# Patient Record
Sex: Male | Born: 1990 | Race: White | Hispanic: No | Marital: Married | Smoking: Current some day smoker
Health system: Southern US, Community
[De-identification: ages and names within clinical notes are randomized; demographics above are authoritative.]

## PROBLEM LIST (undated history)

## (undated) DIAGNOSIS — K219 Gastro-esophageal reflux disease without esophagitis: Secondary | ICD-10-CM

## (undated) DIAGNOSIS — I10 Essential (primary) hypertension: Secondary | ICD-10-CM

## (undated) DIAGNOSIS — I4510 Unspecified right bundle-branch block: Secondary | ICD-10-CM

## (undated) DIAGNOSIS — F410 Panic disorder [episodic paroxysmal anxiety] without agoraphobia: Secondary | ICD-10-CM

## (undated) HISTORY — DX: Gastro-esophageal reflux disease without esophagitis: K21.9

## (undated) HISTORY — DX: Unspecified right bundle-branch block: I45.10

## (undated) HISTORY — DX: Essential (primary) hypertension: I10

---

## 2012-12-18 ENCOUNTER — Emergency Department: Payer: Self-pay | Admitting: Internal Medicine

## 2012-12-18 LAB — COMPREHENSIVE METABOLIC PANEL
Bilirubin,Total: 0.5 mg/dL (ref 0.2–1.0)
Calcium, Total: 9.6 mg/dL (ref 8.5–10.1)
Chloride: 108 mmol/L — ABNORMAL HIGH (ref 98–107)
Creatinine: 1.01 mg/dL (ref 0.60–1.30)
Glucose: 109 mg/dL — ABNORMAL HIGH (ref 65–99)
Osmolality: 278 (ref 275–301)
Potassium: 3.6 mmol/L (ref 3.5–5.1)
SGOT(AST): 29 U/L (ref 15–37)
SGPT (ALT): 23 U/L (ref 12–78)
Total Protein: 8.2 g/dL (ref 6.4–8.2)

## 2012-12-18 LAB — URINALYSIS, COMPLETE
Hyaline Cast: 3
Nitrite: NEGATIVE
Protein: 100
RBC,UR: 1302 /HPF (ref 0–5)
Specific Gravity: 1.024 (ref 1.003–1.030)
Squamous Epithelial: <1

## 2012-12-18 LAB — CBC
HGB: 14.2 g/dL (ref 13.0–18.0)
MCV: 88 fL (ref 80–100)
Platelet: 237 10*3/uL (ref 150–440)
RBC: 4.69 10*6/uL (ref 4.40–5.90)
RDW: 13.2 % (ref 11.5–14.5)

## 2013-01-16 ENCOUNTER — Emergency Department: Payer: Self-pay | Admitting: Emergency Medicine

## 2013-01-16 LAB — BASIC METABOLIC PANEL
Anion Gap: 6 — ABNORMAL LOW (ref 7–16)
Calcium, Total: 9.3 mg/dL (ref 8.5–10.1)
Co2: 26 mmol/L (ref 21–32)
Creatinine: 1.1 mg/dL (ref 0.60–1.30)
EGFR (Non-African Amer.): >60
Potassium: 3.6 mmol/L (ref 3.5–5.1)
Sodium: 135 mmol/L — ABNORMAL LOW (ref 136–145)

## 2013-01-16 LAB — CBC
HCT: 40.6 % (ref 40.0–52.0)
HGB: 13.9 g/dL (ref 13.0–18.0)
MCHC: 34.2 g/dL (ref 32.0–36.0)
MCV: 87 fL (ref 80–100)
RBC: 4.67 10*6/uL (ref 4.40–5.90)
RDW: 12.9 % (ref 11.5–14.5)
WBC: 13.8 10*3/uL — ABNORMAL HIGH (ref 3.8–10.6)

## 2013-01-16 LAB — URINALYSIS, COMPLETE
Bacteria: NONE SEEN
Glucose,UR: NEGATIVE mg/dL (ref 0–75)
Leukocyte Esterase: NEGATIVE
Protein: 30
Specific Gravity: 1.021 (ref 1.003–1.030)
WBC UR: <1 /HPF (ref 0–5)

## 2013-01-28 ENCOUNTER — Ambulatory Visit: Payer: Self-pay | Admitting: Urology

## 2013-01-28 DIAGNOSIS — N201 Calculus of ureter: Secondary | ICD-10-CM | POA: Insufficient documentation

## 2013-01-28 DIAGNOSIS — N23 Unspecified renal colic: Secondary | ICD-10-CM

## 2013-01-28 HISTORY — DX: Calculus of ureter: N20.1

## 2013-01-28 HISTORY — DX: Unspecified renal colic: N23

## 2014-04-26 ENCOUNTER — Emergency Department: Payer: Self-pay | Admitting: Emergency Medicine

## 2014-05-11 ENCOUNTER — Encounter: Payer: Self-pay | Admitting: Cardiovascular Disease

## 2014-05-11 ENCOUNTER — Ambulatory Visit (INDEPENDENT_AMBULATORY_CARE_PROVIDER_SITE_OTHER): Payer: Self-pay | Admitting: Cardiovascular Disease

## 2014-05-11 VITALS — BP 120/90 | HR 75 | Ht 70.0 in | Wt 208.5 lb

## 2014-05-11 DIAGNOSIS — R0602 Shortness of breath: Secondary | ICD-10-CM | POA: Insufficient documentation

## 2014-05-11 DIAGNOSIS — Z87828 Personal history of other (healed) physical injury and trauma: Secondary | ICD-10-CM

## 2014-05-11 DIAGNOSIS — R079 Chest pain, unspecified: Secondary | ICD-10-CM | POA: Insufficient documentation

## 2014-05-11 NOTE — Patient Instructions (Addendum)
You are doing well. No medication changes were made.  Consider trying the naproxen 1 to 2 pills twice a day for chest pain  Please call us if you have new issues that need to be addressed before your next appt.   [facility] [address], [place] [phone]  [facility] [address], [place] [phone]  [facility] [address], [place] [phone]  [facility] [address] [place], [place] [phone]
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
## 2014-05-11 NOTE — Assessment & Plan Note (Signed)
Occasional shortness of breath when he wakes up at nighttime. Otherwise no significant symptoms in the day. Likely noncardiac in nature

## 2014-05-11 NOTE — Assessment & Plan Note (Signed)
Atypical chest pain. Symptoms reviewed with him in detail. He has no significant cardiac risk factors. Symptoms presenting at rest typically, infrequently. Recent workup in the emergency room was normal. No further workup at this time. Suggested he call the office if he starts to have symptoms more consistent with angina, with exertion. Suspect his symptoms are musculoskeletal in nature. Recommended he try NSAIDs, ice, light stretching

## 2016-03-22 ENCOUNTER — Emergency Department
Admission: EM | Admit: 2016-03-22 | Discharge: 2016-03-23 | Disposition: A | Discharge status: Home / Self Care | Payer: Managed Care, Other (non HMO) | Attending: Emergency Medicine | Admitting: Emergency Medicine

## 2016-03-22 ENCOUNTER — Emergency Department: Payer: Managed Care, Other (non HMO)

## 2016-03-22 ENCOUNTER — Encounter: Payer: Self-pay | Admitting: *Deleted

## 2016-03-22 DIAGNOSIS — N200 Calculus of kidney: Secondary | ICD-10-CM | POA: Diagnosis not present

## 2016-03-22 DIAGNOSIS — R55 Syncope and collapse: Secondary | ICD-10-CM | POA: Insufficient documentation

## 2016-03-22 DIAGNOSIS — R109 Unspecified abdominal pain: Secondary | ICD-10-CM | POA: Diagnosis present

## 2016-03-22 LAB — COMPREHENSIVE METABOLIC PANEL
ALT: 24 U/L (ref 17–63)
AST: 28 U/L (ref 15–41)
Albumin: 4.4 g/dL (ref 3.5–5.0)
Alkaline Phosphatase: 63 U/L (ref 38–126)
Anion gap: 7 (ref 5–15)
BUN: 12 mg/dL (ref 6–20)
CO2: 28 mmol/L (ref 22–32)
Calcium: 9.4 mg/dL (ref 8.9–10.3)
Chloride: 106 mmol/L (ref 101–111)
Creatinine, Ser: 0.87 mg/dL (ref 0.61–1.24)
GFR calc Af Amer: >60 mL/min (ref >60)
GFR calc non Af Amer: >60 mL/min (ref >60)
Glucose, Bld: 98 mg/dL (ref 65–99)
Potassium: 4.3 mmol/L (ref 3.5–5.1)
Sodium: 141 mmol/L (ref 135–145)
Total Bilirubin: 0.6 mg/dL (ref 0.3–1.2)
Total Protein: 7.8 g/dL (ref 6.5–8.1)

## 2016-03-22 LAB — URINALYSIS, COMPLETE (UACMP) WITH MICROSCOPIC
Bacteria, UA: NONE SEEN
Bilirubin Urine: NEGATIVE
Glucose, UA: NEGATIVE mg/dL
Hgb urine dipstick: NEGATIVE
Ketones, ur: NEGATIVE mg/dL
Leukocytes, UA: NEGATIVE
Nitrite: NEGATIVE
Protein, ur: NEGATIVE mg/dL
Specific Gravity, Urine: 1.021 (ref 1.005–1.030)
pH: 6 (ref 5.0–8.0)

## 2016-03-22 LAB — CBC
HEMATOCRIT: 41 % (ref 40.0–52.0)
Hemoglobin: 14.2 g/dL (ref 13.0–18.0)
MCH: 29.6 pg (ref 26.0–34.0)
MCHC: 34.6 g/dL (ref 32.0–36.0)
MCV: 85.4 fL (ref 80.0–100.0)
Platelets: 307 10*3/uL (ref 150–440)
RBC: 4.8 MIL/uL (ref 4.40–5.90)
RDW: 12.9 % (ref 11.5–14.5)
WBC: 9.4 10*3/uL (ref 3.8–10.6)

## 2016-03-22 MED ORDER — SODIUM CHLORIDE 0.9 % IV SOLN
Freq: Once | INTRAVENOUS | Status: AC
  Administered 2016-03-22: 22:00:00 via INTRAVENOUS

## 2016-03-22 NOTE — ED Notes (Signed)
Pt had blood drawn.  Soon afterwards, pt states I feel dizzy.  Pt sitting in chair.  Pt passed out and leaned backwards in chair striking his head on  the sink and pipes while still in the chair.  Pt awoke immediately, c-collar applied. Iv started stat.  Pt moved to subwait.  Pt alert and oriented.  Pt did not bite tongue.  No urinary incontience. .Marland Kitchen

## 2016-03-22 NOTE — ED Triage Notes (Signed)
Pt has right flank pain for 3 days.  No n/v/d.  Pt states hx of kidney stones.  Pt alert.

## 2016-03-23 ENCOUNTER — Emergency Department: Payer: Managed Care, Other (non HMO)

## 2016-03-23 MED ORDER — OXYCODONE-ACETAMINOPHEN 5-325 MG PO TABS: 1.0000 | ORAL_TABLET | ORAL | 0 refills | Status: DC | PRN

## 2016-03-23 MED ORDER — TAMSULOSIN HCL 0.4 MG PO CAPS: 0.4000 mg | ORAL_CAPSULE | Freq: Every day | ORAL | 0 refills | Status: DC
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
## 2016-10-08 ENCOUNTER — Encounter: Payer: Self-pay | Admitting: Emergency Medicine

## 2016-10-08 ENCOUNTER — Emergency Department: Payer: Managed Care, Other (non HMO)

## 2016-10-08 ENCOUNTER — Emergency Department
Admission: EM | Admit: 2016-10-08 | Discharge: 2016-10-08 | Disposition: A | Discharge status: Home / Self Care | Payer: Managed Care, Other (non HMO) | Attending: Emergency Medicine | Admitting: Emergency Medicine

## 2016-10-08 DIAGNOSIS — R0789 Other chest pain: Secondary | ICD-10-CM | POA: Insufficient documentation

## 2016-10-08 HISTORY — DX: Panic disorder (episodic paroxysmal anxiety): F41.0

## 2016-10-08 LAB — CBC
HEMATOCRIT: 40.1 % (ref 40.0–52.0)
HEMOGLOBIN: 13.8 g/dL (ref 13.0–18.0)
MCH: 29.3 pg (ref 26.0–34.0)
MCHC: 34.3 g/dL (ref 32.0–36.0)
MCV: 85.4 fL (ref 80.0–100.0)
Platelets: 258 10*3/uL (ref 150–440)
RBC: 4.7 MIL/uL (ref 4.40–5.90)
RDW: 13.3 % (ref 11.5–14.5)
WBC: 7.3 10*3/uL (ref 3.8–10.6)

## 2016-10-08 LAB — BASIC METABOLIC PANEL
ANION GAP: 7 (ref 5–15)
BUN: 15 mg/dL (ref 6–20)
CHLORIDE: 106 mmol/L (ref 101–111)
CO2: 25 mmol/L (ref 22–32)
Calcium: 9 mg/dL (ref 8.9–10.3)
Creatinine, Ser: 0.99 mg/dL (ref 0.61–1.24)
Glucose, Bld: 105 mg/dL — ABNORMAL HIGH (ref 65–99)
POTASSIUM: 3.5 mmol/L (ref 3.5–5.1)
SODIUM: 138 mmol/L (ref 135–145)

## 2016-10-08 LAB — TROPONIN I: Troponin I: <0.03 ng/mL (ref <0.03)

## 2016-10-08 NOTE — ED Triage Notes (Signed)
Patient with complaint of intermittent left side chest pain times one with come shortness of breath and nausea. Patient states that he has a history of a panic attack but this does not feel the same.

## 2016-10-08 NOTE — ED Notes (Signed)
Pt. Reports intermittent chest pain for the past week.  Pt. States similar symptoms 3 years ago when he had panic attack.

## 2016-10-08 NOTE — Discharge Instructions (Signed)
Please make an appointment to establish care with a primary care physician this week for recheck. Fortunately today your bloodwork EKG and chest x-ray were all reassuring and you did not have a heart attack. Return to the emergency department for any concerns.  It was a pleasure to take care of you today, and thank you for coming to our emergency department.  If you have any questions or concerns before leaving please ask the nurse to grab me and I'm more than happy to go through your aftercare instructions again.  If you were prescribed any opioid pain medication today such as Norco, Vicodin, Percocet, morphine, hydrocodone, or oxycodone please make sure you do not drive when you are taking this medication as it can alter your ability to drive safely.  If you have any concerns once you are home that you are not improving or are in fact getting worse before you can make it to your follow-up appointment, please do not hesitate to call [phone] and come back for further evaluation.  Merrily BrittleNeil Keirra Zeimet, MD  Results for orders placed or performed during the hospital encounter of 10/08/16  Basic metabolic panel  Result Value Ref Range   Sodium 138 135 - 145 mmol/L   Potassium 3.5 3.5 - 5.1 mmol/L   Chloride 106 101 - 111 mmol/L   CO2 25 22 - 32 mmol/L   Glucose, Bld 105 (H) 65 - 99 mg/dL   BUN 15 6 - 20 mg/dL   Creatinine, Ser 1.610.99 0.61 - 1.24 mg/dL   Calcium 9.0 8.9 - 09.610.3 mg/dL   GFR calc non Af Amer >60 >60 mL/min   GFR calc Af Amer >60 >60 mL/min   Anion gap 7 5 - 15  CBC  Result Value Ref Range   WBC 7.3 3.8 - 10.6 K/uL   RBC 4.70 4.40 - 5.90 MIL/uL   Hemoglobin 13.8 13.0 - 18.0 g/dL   HCT 04.540.1 40.940.0 - 81.152.0 %   MCV 85.4 80.0 - 100.0 fL   MCH 29.3 26.0 - 34.0 pg   MCHC 34.3 32.0 - 36.0 g/dL   RDW 91.413.3 78.211.5 - 95.614.5 %   Platelets 258 150 - 440 K/uL  Troponin I  Result Value Ref Range   Troponin I <0.03 <0.03 ng/mL   Dg Chest 2 View  Result Date: 10/08/2016 CLINICAL DATA:  Intermittent  left-sided chest pain with dyspnea and nausea. EXAM: CHEST  2 VIEW COMPARISON:  04/26/2014 FINDINGS: The lungs are clear. The pulmonary vasculature is normal. Heart size is normal. Hilar and mediastinal contours are unremarkable. There is no pleural effusion. IMPRESSION: No active cardiopulmonary disease. Electronically Signed   By: Ellery Plunkaniel R Mitchell M.D.   On: 10/08/2016 03:19
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
## 2016-10-08 NOTE — ED Notes (Signed)
Pt. Going home with friend 

## 2019-11-10 ENCOUNTER — Inpatient Hospital Stay
Admission: EM | Admit: 2019-11-10 | Discharge: 2019-11-12 | DRG: 177 | Disposition: A | Discharge status: Home / Self Care | Payer: BC Managed Care – PPO | Attending: Internal Medicine | Admitting: Internal Medicine

## 2019-11-10 ENCOUNTER — Emergency Department: Payer: BC Managed Care – PPO

## 2019-11-10 ENCOUNTER — Encounter: Payer: Self-pay | Admitting: Emergency Medicine

## 2019-11-10 ENCOUNTER — Other Ambulatory Visit: Payer: Self-pay

## 2019-11-10 DIAGNOSIS — I2721 Secondary pulmonary arterial hypertension: Secondary | ICD-10-CM | POA: Diagnosis present

## 2019-11-10 DIAGNOSIS — Z79899 Other long term (current) drug therapy: Secondary | ICD-10-CM | POA: Diagnosis not present

## 2019-11-10 DIAGNOSIS — I4510 Unspecified right bundle-branch block: Secondary | ICD-10-CM | POA: Diagnosis present

## 2019-11-10 DIAGNOSIS — U071 COVID-19: Secondary | ICD-10-CM | POA: Diagnosis present

## 2019-11-10 DIAGNOSIS — Z882 Allergy status to sulfonamides status: Secondary | ICD-10-CM | POA: Diagnosis not present

## 2019-11-10 DIAGNOSIS — F410 Panic disorder [episodic paroxysmal anxiety] without agoraphobia: Secondary | ICD-10-CM | POA: Diagnosis present

## 2019-11-10 DIAGNOSIS — J9601 Acute respiratory failure with hypoxia: Secondary | ICD-10-CM | POA: Diagnosis present

## 2019-11-10 DIAGNOSIS — R0902 Hypoxemia: Secondary | ICD-10-CM

## 2019-11-10 DIAGNOSIS — J9600 Acute respiratory failure, unspecified whether with hypoxia or hypercapnia: Secondary | ICD-10-CM | POA: Diagnosis not present

## 2019-11-10 DIAGNOSIS — I272 Pulmonary hypertension, unspecified: Secondary | ICD-10-CM | POA: Diagnosis not present

## 2019-11-10 DIAGNOSIS — Z87891 Personal history of nicotine dependence: Secondary | ICD-10-CM

## 2019-11-10 DIAGNOSIS — J1282 Pneumonia due to coronavirus disease 2019: Secondary | ICD-10-CM | POA: Diagnosis present

## 2019-11-10 DIAGNOSIS — Z8249 Family history of ischemic heart disease and other diseases of the circulatory system: Secondary | ICD-10-CM | POA: Diagnosis not present

## 2019-11-10 LAB — C-REACTIVE PROTEIN: CRP: 1.7 mg/dL — ABNORMAL HIGH (ref <1.0)

## 2019-11-10 LAB — PROCALCITONIN
Procalcitonin: <0.1 ng/mL
Procalcitonin: <0.1 ng/mL

## 2019-11-10 LAB — CBC WITH DIFFERENTIAL/PLATELET
Abs Immature Granulocytes: 0.02 10*3/uL (ref 0.00–0.07)
Basophils Absolute: 0 10*3/uL (ref 0.0–0.1)
Basophils Relative: 0 %
Eosinophils Absolute: 0 10*3/uL (ref 0.0–0.5)
Eosinophils Relative: 0 %
HCT: 42.9 % (ref 39.0–52.0)
Hemoglobin: 14.2 g/dL (ref 13.0–17.0)
Immature Granulocytes: 1 %
Lymphocytes Relative: 28 %
Lymphs Abs: 1.2 10*3/uL (ref 0.7–4.0)
MCH: 30.1 pg (ref 26.0–34.0)
MCHC: 33.1 g/dL (ref 30.0–36.0)
MCV: 90.9 fL (ref 80.0–100.0)
Monocytes Absolute: 0.5 10*3/uL (ref 0.1–1.0)
Monocytes Relative: 12 %
Neutro Abs: 2.5 10*3/uL (ref 1.7–7.7)
Neutrophils Relative %: 59 %
Platelets: 178 10*3/uL (ref 150–400)
RBC: 4.72 MIL/uL (ref 4.22–5.81)
RDW: 13.2 % (ref 11.5–15.5)
Smear Review: NORMAL
WBC: 4.2 10*3/uL (ref 4.0–10.5)
nRBC: 0 % (ref 0.0–0.2)

## 2019-11-10 LAB — FERRITIN: Ferritin: 607 ng/mL — ABNORMAL HIGH (ref 24–336)

## 2019-11-10 LAB — CBC
HCT: 44.8 % (ref 39.0–52.0)
Hemoglobin: 14.9 g/dL (ref 13.0–17.0)
MCH: 30 pg (ref 26.0–34.0)
MCHC: 33.3 g/dL (ref 30.0–36.0)
MCV: 90.1 fL (ref 80.0–100.0)
Platelets: 184 10*3/uL (ref 150–400)
RBC: 4.97 MIL/uL (ref 4.22–5.81)
RDW: 13.2 % (ref 11.5–15.5)
WBC: 5 10*3/uL (ref 4.0–10.5)
nRBC: 0 % (ref 0.0–0.2)

## 2019-11-10 LAB — BASIC METABOLIC PANEL
Anion gap: 10 (ref 5–15)
BUN: 14 mg/dL (ref 6–20)
CO2: 25 mmol/L (ref 22–32)
Calcium: 8.3 mg/dL — ABNORMAL LOW (ref 8.9–10.3)
Chloride: 100 mmol/L (ref 98–111)
Creatinine, Ser: 1.13 mg/dL (ref 0.61–1.24)
GFR calc Af Amer: >60 mL/min (ref >60)
GFR calc non Af Amer: >60 mL/min (ref >60)
Glucose, Bld: 118 mg/dL — ABNORMAL HIGH (ref 70–99)
Potassium: 3.7 mmol/L (ref 3.5–5.1)
Sodium: 135 mmol/L (ref 135–145)

## 2019-11-10 LAB — LACTATE DEHYDROGENASE: LDH: 209 U/L — ABNORMAL HIGH (ref 98–192)

## 2019-11-10 LAB — TROPONIN I (HIGH SENSITIVITY)
Troponin I (High Sensitivity): 4 ng/L (ref <18)
Troponin I (High Sensitivity): 8 ng/L (ref <18)
Troponin I (High Sensitivity): 4 ng/L (ref <18)

## 2019-11-10 LAB — FIBRIN DERIVATIVES D-DIMER (ARMC ONLY): Fibrin derivatives D-dimer (ARMC): 496.32 ng/mL (FEU) (ref 0.00–499.00)

## 2019-11-10 LAB — FIBRINOGEN: Fibrinogen: 631 mg/dL — ABNORMAL HIGH (ref 210–475)

## 2019-11-10 MED ORDER — FAMOTIDINE 20 MG PO TABS
20.0000 mg | ORAL_TABLET | Freq: Two times a day (BID) | ORAL | Status: DC
  Administered 2019-11-10 – 2019-11-12 (×4): 20 mg via ORAL
  Filled 2019-11-10 (×4): qty 1

## 2019-11-10 MED ORDER — HYDROCOD POLST-CPM POLST ER 10-8 MG/5ML PO SUER: 5.0000 mL | Freq: Two times a day (BID) | ORAL | Status: DC | PRN

## 2019-11-10 MED ORDER — SODIUM CHLORIDE 0.9 % IV SOLN
100.0000 mg | Freq: Every day | INTRAVENOUS | Status: DC
  Administered 2019-11-11 – 2019-11-12 (×2): 100 mg via INTRAVENOUS
  Filled 2019-11-10: qty 20
  Filled 2019-11-10: qty 100

## 2019-11-10 MED ORDER — ACETAMINOPHEN 325 MG PO TABS: 650.0000 mg | ORAL_TABLET | Freq: Four times a day (QID) | ORAL | Status: DC | PRN

## 2019-11-10 MED ORDER — ASPIRIN EC 81 MG PO TBEC
81.0000 mg | DELAYED_RELEASE_TABLET | Freq: Every day | ORAL | Status: DC
  Administered 2019-11-10 – 2019-11-12 (×3): 81 mg via ORAL
  Filled 2019-11-10 (×3): qty 1

## 2019-11-10 MED ORDER — ONDANSETRON HCL 4 MG PO TABS: 4.0000 mg | ORAL_TABLET | Freq: Four times a day (QID) | ORAL | Status: DC | PRN

## 2019-11-10 MED ORDER — ONDANSETRON HCL 4 MG/2ML IJ SOLN: 4.0000 mg | Freq: Four times a day (QID) | INTRAMUSCULAR | Status: DC | PRN

## 2019-11-10 MED ORDER — ZINC SULFATE 220 (50 ZN) MG PO CAPS
220.0000 mg | ORAL_CAPSULE | Freq: Every day | ORAL | Status: DC
  Administered 2019-11-10 – 2019-11-12 (×3): 220 mg via ORAL
  Filled 2019-11-10 (×3): qty 1

## 2019-11-10 MED ORDER — BARICITINIB 2 MG PO TABS
4.0000 mg | ORAL_TABLET | Freq: Every day | ORAL | Status: DC
  Administered 2019-11-10: 4 mg via ORAL
  Filled 2019-11-10: qty 2

## 2019-11-10 MED ORDER — TRAZODONE HCL 50 MG PO TABS: 25.0000 mg | ORAL_TABLET | Freq: Every evening | ORAL | Status: DC | PRN

## 2019-11-10 MED ORDER — ENOXAPARIN SODIUM 40 MG/0.4ML ~~LOC~~ SOLN
40.0000 mg | SUBCUTANEOUS | Status: DC
  Administered 2019-11-10 – 2019-11-11 (×2): 40 mg via SUBCUTANEOUS
  Filled 2019-11-10 (×2): qty 0.4

## 2019-11-10 MED ORDER — GUAIFENESIN-DM 100-10 MG/5ML PO SYRP
10.0000 mL | ORAL_SOLUTION | ORAL | Status: DC | PRN
  Filled 2019-11-10: qty 10

## 2019-11-10 MED ORDER — ASCORBIC ACID 500 MG PO TABS
500.0000 mg | ORAL_TABLET | Freq: Every day | ORAL | Status: DC
  Administered 2019-11-10 – 2019-11-12 (×3): 500 mg via ORAL
  Filled 2019-11-10 (×3): qty 1

## 2019-11-10 MED ORDER — IOHEXOL 350 MG/ML SOLN
75.0000 mL | Freq: Once | INTRAVENOUS | Status: AC | PRN
  Administered 2019-11-10: 75 mL via INTRAVENOUS
  Filled 2019-11-10: qty 75

## 2019-11-10 MED ORDER — SODIUM CHLORIDE 0.9 % IV SOLN
200.0000 mg | Freq: Once | INTRAVENOUS | Status: AC
  Administered 2019-11-10: 23:00:00 200 mg via INTRAVENOUS
  Filled 2019-11-10: qty 200

## 2019-11-10 MED ORDER — VITAMIN D 25 MCG (1000 UNIT) PO TABS
1000.0000 [IU] | ORAL_TABLET | Freq: Every day | ORAL | Status: DC
  Administered 2019-11-10 – 2019-11-12 (×3): 1000 [IU] via ORAL
  Filled 2019-11-10 (×3): qty 1

## 2019-11-10 MED ORDER — DEXAMETHASONE SODIUM PHOSPHATE 10 MG/ML IJ SOLN
6.0000 mg | INTRAMUSCULAR | Status: DC
  Administered 2019-11-10 – 2019-11-11 (×2): 6 mg via INTRAVENOUS
  Filled 2019-11-10 (×2): qty 1

## 2019-11-10 MED ORDER — SODIUM CHLORIDE 0.9 % IV SOLN: INTRAVENOUS | Status: DC

## 2019-11-10 MED ORDER — GUAIFENESIN ER 600 MG PO TB12
600.0000 mg | ORAL_TABLET | Freq: Two times a day (BID) | ORAL | Status: DC
  Administered 2019-11-10 – 2019-11-12 (×4): 600 mg via ORAL
  Filled 2019-11-10 (×4): qty 1

## 2019-11-10 MED ORDER — MAGNESIUM HYDROXIDE 400 MG/5ML PO SUSP
30.0000 mL | Freq: Every day | ORAL | Status: DC | PRN
  Filled 2019-11-10: qty 30

## 2019-11-10 NOTE — ED Triage Notes (Signed)
Pt in via EMS from home via EMS. Pt was confirmed COVID + 5 days ago. Pt with difficulty breathing today. Pt 89% on AR, pt placed on 4L sats increased. 122/66, HR 90

## 2019-11-10 NOTE — H&P (Signed)
[place]   PATIENT NAME: Philip Martinez    MR#:  [id number]  DATE OF BIRTH:  1990-10-23  DATE OF ADMISSION:  11/10/2019  PRIMARY CARE PHYSICIAN: Patient, No Pcp Per   REQUESTING/REFERRING PHYSICIAN: Dorothea Glassman, MD  CHIEF COMPLAINT:   Chief Complaint  Patient presents with  . Shortness of Breath    HISTORY OF PRESENT ILLNESS:  Philip Martinez  is a 29 y.o. Hispanic American male with no chronic medical problems, who presented to the emergency room with acute onset of dyspnea with diminished pulse oximetry to 90% when he was checking it on his apple watch.  He tested positive for COVID-19 on Tuesday and prior to that he started having generalized fatigue and body aches and headaches on Monday and get his results back on Wednesday.  His cough and wheezing started on Friday and and his cough has been mainly dry.  He admitted to loss of taste but maintains sense of smell.  He admitted to diarrhea without nausea or vomiting.  He has nasal congestion as well without sore throat or earache.  He had low-grade fever here without chills.  Upon presentation to the emergency room, temperature was 100.2 with pulse oximetry of 97 to 98% on 2 L of O2 by nasal cannula.  Labs revealed unremarkable BMP.  LDH was 209 and high-sensitivity troponin I was 4 and later 8 then 4.  Ferritin was 607 and procalcitonin less than 0.1.  CBC showed no abnormalities.  Fibrin derivatives D-dimer was 496.32 and fibrinogen 631.  Two-view chest x-ray showed normal focal pulmonary infiltrates or atelectasis within the posterior basal right lower lobe and chest CTA showed no evidence for PE but did show borderline enlarged pulmonary trunk that can be seen with pulmonary artery hypertension and multifocal patchy areas of mixed consolidative and groundglass opacity with more confluent opacity throughout the right lower lobe favoring multifocal pneumonia that is seen with COVID-19.  The patient will be admitted to a  medical monitored isolation bed for further evaluation and management. PAST MEDICAL HISTORY:   Past Medical History:  Diagnosis Date  . Incomplete RBBB   . Panic attack     PAST SURGICAL HISTORY:  History reviewed. No pertinent surgical history.  No previous surgeries.  SOCIAL HISTORY:   Social History   Tobacco Use  . Smoking status: Former Games developer  . Smokeless tobacco: Never Used  Substance Use Topics  . Alcohol use: Yes    Comment: occ    FAMILY HISTORY:   Family History  Problem Relation Age of Onset  . Hypertension Mother   . Heart failure Paternal Grandmother     DRUG ALLERGIES:   Allergies  Allergen Reactions  . Sulfur     Hives     REVIEW OF SYSTEMS:   ROS As per history of present illness. All pertinent systems were reviewed above. Constitutional, HEENT, cardiovascular, respiratory, GI, GU, musculoskeletal, neuro, psychiatric, endocrine, integumentary and hematologic systems were reviewed and are otherwise negative/unremarkable except for positive findings mentioned above in the HPI.   MEDICATIONS AT HOME:   Prior to Admission medications   Medication Sig Start Date End Date Taking? Authorizing Provider  oxyCODONE-acetaminophen (ROXICET) 5-325 MG tablet Take 1 tablet by mouth every 4 (four) hours as needed for severe pain. Patient not taking: Reported on 11/10/2019 03/23/16   Darci Current, MD  tamsulosin (FLOMAX) 0.4 MG CAPS capsule Take 1 capsule (0.4 mg total) by mouth daily after breakfast. Patient not taking:  Reported on 11/10/2019 03/23/16   Darci Current, MD      VITAL SIGNS:  Blood pressure 130/79, pulse 96, temperature 100.2 F (37.9 C), resp. rate 17, height 5\' 9"  (1.753 m), weight 90.7 kg, SpO2 95 %.  PHYSICAL EXAMINATION:  Physical Exam  GENERAL:  29 y.o.-year-old Caucasian male patient lying in the bed with mild conversational dyspnea. EYES: Pupils equal, round, reactive to light and accommodation. No scleral icterus.  Extraocular muscles intact.  HEENT: Head atraumatic, normocephalic. Oropharynx and nasopharynx clear.  NECK:  Supple, no jugular venous distention. No thyroid enlargement, no tenderness.  LUNGS: Diminished bibasal breath sounds with bibasal crackles. CARDIOVASCULAR: Regular rate and rhythm, S1, S2 normal. No murmurs, rubs, or gallops.  ABDOMEN: Soft, nondistended, nontender. Bowel sounds present. No organomegaly or mass.  EXTREMITIES: No pedal edema, cyanosis, or clubbing.  NEUROLOGIC: Cranial nerves II through XII are intact. Muscle strength 5/5 in all extremities. Sensation intact. Gait not checked.  PSYCHIATRIC: The patient is alert and oriented x 3.  Normal affect and good eye contact. SKIN: No obvious rash, lesion, or ulcer.   LABORATORY PANEL:   CBC Recent Labs  Lab 11/10/19 2145  WBC 4.2  HGB 14.2  HCT 42.9  PLT 178   ------------------------------------------------------------------------------------------------------------------  Chemistries  Recent Labs  Lab 11/10/19 1535  NA 135  K 3.7  CL 100  CO2 25  GLUCOSE 118*  BUN 14  CREATININE 1.13  CALCIUM 8.3*   ------------------------------------------------------------------------------------------------------------------  Cardiac Enzymes No results for input(s): TROPONINI in the last 168 hours. ------------------------------------------------------------------------------------------------------------------  RADIOLOGY:  DG Chest 2 View  Result Date: 11/10/2019 CLINICAL DATA:  Chest pain, dyspnea, COVID pneumonia EXAM: CHEST - 2 VIEW COMPARISON:  10/08/2016 FINDINGS: The lungs are well expanded and are symmetric. There has developed minimal focal pulmonary infiltrate or atelectasis within the a posterior basal right lower lobe. Lungs are otherwise clear. No pneumothorax or pleural effusion. Cardiac size within normal limits. No acute bone abnormality. IMPRESSION: Minimal focal pulmonary infiltrate or atelectasis  within the posterior basal right lower lobe. Electronically Signed   By: 10/10/2016 MD   On: 11/10/2019 16:27   CT Angio Chest PE W and/or Wo Contrast  Result Date: 11/10/2019 CLINICAL DATA:  COVID-19 positive, negative D-dimer, hypoxic EXAM: CT ANGIOGRAPHY CHEST WITH CONTRAST TECHNIQUE: Multidetector CT imaging of the chest was performed using the standard protocol during bolus administration of intravenous contrast. Multiplanar CT image reconstructions and MIPs were obtained to evaluate the vascular anatomy. CONTRAST:  70mL OMNIPAQUE IOHEXOL 350 MG/ML SOLN COMPARISON:  Radiograph 11/10/2019 FINDINGS: Cardiovascular: Satisfactory opacification the pulmonary arteries to the segmental level. No pulmonary artery filling defects are identified. Pulmonary trunk is borderline enlarged. Normal heart size. No pericardial effusion. The aorta is normal caliber. No acute luminal abnormality. No periaortic stranding or hemorrhage. Shared origin of brachiocephalic and left common carotid artery. Proximal great vessels otherwise unremarkable. Mediastinum/Nodes: Scattered subcentimeter low-attenuation mediastinal and hilar nodes favored to be reactive. No worrisome mediastinal, hilar or axillary adenopathy. No acute abnormality of the trachea or esophagus. Thyroid gland and thoracic inlet are unremarkable. Lungs/Pleura: Multifocal patchy areas of mixed consolidative and ground-glass opacity with more confluent opacity throughout the right lower lobe in a posterior, peripheral predominance. No pneumothorax. No effusion. Mild airways thickening. No convincing features of edema. Upper Abdomen: No acute abnormalities present in the visualized portions of the upper abdomen. Musculoskeletal: No acute osseous abnormality or suspicious osseous lesion. Multilevel degenerative changes are present in the imaged portions of the spine.  Mild degenerative changes in the shoulders as well. Review of the MIP images confirms the above  findings. IMPRESSION: 1. No evidence of pulmonary embolism. 2. Borderline enlarged pulmonary trunk, can be seen with pulmonary arterial hypertension. 3. Multifocal patchy areas of mixed consolidative and ground-glass opacity with more confluent opacity throughout the right lower lobe. Findings are favored to represent multifocal pneumonia in the setting of COVID-19. Electronically Signed   By: Kreg Shropshire M.D.   On: 11/10/2019 20:03      IMPRESSION AND PLAN:     1.  Multifocal pneumonia secondary to COVID-19 with subsequent hypoxemia. -The patient will be admitted to an isolation monitored bed with droplet and contact precautions. -The patient will be placed on scheduled Mucinex and as needed Tussionex. -We will avoid nebulization as much as we can, give bronchodilator MDI if needed, and with deterioration of oxygenation try to avoid BiPAP/CPAP if possible.    -Will obtain sputum Gram stain culture and sensitivity and follow blood cultures. -O2 protocol will be followed. -We will follow CRP, ferritin, LDH and D-dimer. -Will follow manual differential for ANC/ALC ratio as well as follow troponin I and daily CBC with manual differential and CMP. - Will place the patient on IV Remdesivir and IV steroid therapy with Decadron with elevated inflammatory markers. -The patient will be placed on vitamin D3, vitamin C, zinc sulfate, p.o. Pepcid and aspirin. -I discussed baricitinib with the patient and he agreed to proceed with it.  2.  Pulmonary artery hypertension. -This like secondary to his multifocal pneumonia. -Management as above.  3.  DVT prophylaxis. -Subcutaneous Lovenox.    All the records are reviewed and case discussed with ED provider. The plan of care was discussed in details with the patient (and family). I answered all questions. The patient agreed to proceed with the above mentioned plan. Further management will depend upon hospital course.   CODE STATUS: Full  code  Status is: Inpatient  Remains inpatient appropriate because:Ongoing diagnostic testing needed not appropriate for outpatient work up, Unsafe d/c plan, IV treatments appropriate due to intensity of illness or inability to take PO and Inpatient level of care appropriate due to severity of illness   Dispo: The patient is from: Home              Anticipated d/c is to: Home              Anticipated d/c date is: 2 days              Patient currently is not medically stable to d/c.   TOTAL TIME TAKING CARE OF THIS PATIENT: 55 minutes.    Hannah Beat M.D on 11/10/2019 at 10:45 PM  [facility]   From 7 PM-7 AM, contact night-coverage [email or web address]  CC: Primary care physician; Patient, No Pcp Per   Note: This dictation was prepared with Dragon dictation along with smaller phrase technology. Any transcriptional typo errors that result from this process are unintentional.

## 2019-11-10 NOTE — ED Notes (Signed)
Pt given meal tray and water to drink. Pt denies pain or needs at this time
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
## 2019-11-10 NOTE — Consult Note (Signed)
Remdesivir - Pharmacy Brief Note   O:  ALT: CMP ordered pending collection CXR: "Minimal focal pulmonary infiltrate or atelectasis within the posterior basal right lower lobe" SpO2: Hypoxic requiring supplemental O2   A/P:  Patient reported positive SARS-CoV-2 test 11/05/19. No testing available from this admission at this time to confirm.  Remdesivir 200 mg IVPB once followed by 100 mg IVPB daily x 4 days.   Tressie Ellis 11/10/2019 9:11 PM

## 2019-11-10 NOTE — ED Triage Notes (Signed)
Pt dx with covid last Wednesday.  Here for [facility] and slight chest pain that he thinks is from cough.  Unlabored at this time.  On 2 L [place] at this time.  NAD. Color WNL.  No increased WOB currently.

## 2019-11-11 LAB — COMPREHENSIVE METABOLIC PANEL
ALT: 36 U/L (ref 0–44)
AST: 30 U/L (ref 15–41)
Albumin: 3.8 g/dL (ref 3.5–5.0)
Alkaline Phosphatase: 34 U/L — ABNORMAL LOW (ref 38–126)
Anion gap: 10 (ref 5–15)
BUN: 12 mg/dL (ref 6–20)
CO2: 26 mmol/L (ref 22–32)
Calcium: 8.2 mg/dL — ABNORMAL LOW (ref 8.9–10.3)
Chloride: 100 mmol/L (ref 98–111)
Creatinine, Ser: 0.91 mg/dL (ref 0.61–1.24)
GFR calc Af Amer: >60 mL/min (ref >60)
GFR calc non Af Amer: >60 mL/min (ref >60)
Glucose, Bld: 140 mg/dL — ABNORMAL HIGH (ref 70–99)
Potassium: 4.3 mmol/L (ref 3.5–5.1)
Sodium: 136 mmol/L (ref 135–145)
Total Bilirubin: 0.7 mg/dL (ref 0.3–1.2)
Total Protein: 7.9 g/dL (ref 6.5–8.1)

## 2019-11-11 LAB — CBC WITH DIFFERENTIAL/PLATELET
Abs Immature Granulocytes: 0.03 10*3/uL (ref 0.00–0.07)
Basophils Absolute: 0 10*3/uL (ref 0.0–0.1)
Basophils Relative: 0 %
Eosinophils Absolute: 0 10*3/uL (ref 0.0–0.5)
Eosinophils Relative: 0 %
HCT: 42.1 % (ref 39.0–52.0)
Hemoglobin: 14.2 g/dL (ref 13.0–17.0)
Immature Granulocytes: 1 %
Lymphocytes Relative: 35 %
Lymphs Abs: 0.9 10*3/uL (ref 0.7–4.0)
MCH: 29.8 pg (ref 26.0–34.0)
MCHC: 33.7 g/dL (ref 30.0–36.0)
MCV: 88.3 fL (ref 80.0–100.0)
Monocytes Absolute: 0.3 10*3/uL (ref 0.1–1.0)
Monocytes Relative: 12 %
Neutro Abs: 1.4 10*3/uL — ABNORMAL LOW (ref 1.7–7.7)
Neutrophils Relative %: 52 %
Platelets: 208 10*3/uL (ref 150–400)
RBC: 4.77 MIL/uL (ref 4.22–5.81)
RDW: 13.1 % (ref 11.5–15.5)
Smear Review: NORMAL
WBC: 2.6 10*3/uL — ABNORMAL LOW (ref 4.0–10.5)
nRBC: 0 % (ref 0.0–0.2)

## 2019-11-11 LAB — MAGNESIUM: Magnesium: 2.3 mg/dL (ref 1.7–2.4)

## 2019-11-11 LAB — FIBRIN DERIVATIVES D-DIMER (ARMC ONLY): Fibrin derivatives D-dimer (ARMC): 460.58 ng/mL (FEU) (ref 0.00–499.00)

## 2019-11-11 LAB — C-REACTIVE PROTEIN: CRP: 1.9 mg/dL — ABNORMAL HIGH (ref <1.0)

## 2019-11-11 LAB — HIV ANTIBODY (ROUTINE TESTING W REFLEX): HIV Screen 4th Generation wRfx: NONREACTIVE

## 2019-11-11 LAB — FERRITIN: Ferritin: 665 ng/mL — ABNORMAL HIGH (ref 24–336)

## 2019-11-11 NOTE — Progress Notes (Signed)
Confirmed with [facility] department, patient tested positive on 11/04/19 for Covid-19.

## 2019-11-11 NOTE — Progress Notes (Signed)
PROGRESS NOTE    Philip Martinez  MOQ:[id number] DOB: Sep 18, 1990 DOA: 11/10/2019 PCP: Patient, No Pcp Per    Brief Narrative:   29 y.o. Hispanic American male with no chronic medical problems, who presented to the emergency room with acute onset of dyspnea with diminished pulse oximetry to 90% when he was checking it on his apple watch.  He tested positive for COVID-19 on Tuesday and prior to that he started having generalized fatigue and body aches and headaches on Monday and get his results back on Wednesday.  His cough and wheezing started on Friday and and his cough has been mainly dry.  He admitted to loss of taste but maintains sense of smell.  He admitted to diarrhea without nausea or vomiting.  He has nasal congestion as well without sore throat or earache.  He had low-grade fever here without chills.  8/31: Patient seen and examined.  Continues to have some respiratory distress but overall clinically improving.  Currently on room air.   Assessment & Plan:   Active Problems:   Pneumonia due to 2019 novel coronavirus  Multifocal pneumonia secondary to COVID-19 Mild hypoxia secondary to above Oxygen requirement has been weaned off at this time No fevers over interval Plan: Continue remdesivir, 11/10/2019- Continue Decadron, 11/10/2019- No indication for baricitinib No indication for systemic antibiotics Continue to prone as tolerated Stress incentive spirometry use Continue supplementary vitamins Oxygen as needed Patient remains fever free with no oxygen requirement can likely discharge home tomorrow 11/12/2019  Possible pulmonary arterial hypertension Noted on CT Unclear etiology, likely secondary to acute issues Continue to monitor Consider outpatient follow-up for repeat study after resolution of acute illness   DVT prophylaxis: Lovenox Code Status: Full Family Communication: None today.  Plan of care discussed with patient Disposition Plan: Status is:  Inpatient  Remains inpatient appropriate because:Inpatient level of care appropriate due to severity of illness   Dispo: The patient is from: Home              Anticipated d/c is to: Home              Anticipated d/c date is: 1 day              Patient currently is not medically stable to d/c.  While the patient has been weaned off oxygen his shortness of breath still persistent.  We will plan on 1 additional day of inpatient monitoring for remdesivir, steroids.  Patient remains fever free with no oxygen requirement anticipate discharge home on 11/12/2019       Consultants:   None  Procedures:   None  Antimicrobials:   Remdesivir   Subjective: Seen and examined.  Reports improvement in shortness of breath and cough.  Objective: Vitals:   11/10/19 2145 11/10/19 2232 11/11/19 0441 11/11/19 0723  BP: (!) 142/86 130/79 116/83 121/77  Pulse: 88 96 70 68  Resp: 12 17 17 18   Temp:  100.2 F (37.9 C) 97.8 F (36.6 C) 97.8 F (36.6 C)  TempSrc:   Oral   SpO2: 97% 95% 95% 96%  Weight:      Height:        Intake/Output Summary (Last 24 hours) at 11/11/2019 1024 Last data filed at 11/11/2019 0400 Gross per 24 hour  Intake 471.67 ml  Output --  Net 471.67 ml   Filed Weights   11/10/19 1532  Weight: 90.7 kg    Examination:  General exam: Appears calm and comfortable  Respiratory system: Scattered crackles  bilaterally.  Normal work of breathing.  Room air Cardiovascular system: S1 & S2 heard, RRR. No JVD, murmurs, rubs, gallops or clicks. No pedal edema. Gastrointestinal system: Abdomen is nondistended, soft and nontender. No organomegaly or masses felt. Normal bowel sounds heard. Central nervous system: Alert and oriented. No focal neurological deficits. Extremities: Symmetric 5 x 5 power. Skin: No rashes, lesions or ulcers Psychiatry: Judgement and insight appear normal. Mood & affect appropriate.     Data Reviewed: I have personally reviewed following labs  and imaging studies  CBC: Recent Labs  Lab 11/10/19 1535 11/10/19 2145 11/11/19 0510  WBC 5.0 4.2 2.6*  NEUTROABS  --  2.5 1.4*  HGB 14.9 14.2 14.2  HCT 44.8 42.9 42.1  MCV 90.1 90.9 88.3  PLT 184 178 208   Basic Metabolic Panel: Recent Labs  Lab 11/10/19 1535 11/11/19 0510  NA 135 136  K 3.7 4.3  CL 100 100  CO2 25 26  GLUCOSE 118* 140*  BUN 14 12  CREATININE 1.13 0.91  CALCIUM 8.3* 8.2*  MG  --  2.3   GFR: Estimated Creatinine Clearance: 133.3 mL/min (by C-G formula based on SCr of 0.91 mg/dL). Liver Function Tests: Recent Labs  Lab 11/11/19 0510  AST 30  ALT 36  ALKPHOS 34*  BILITOT 0.7  PROT 7.9  ALBUMIN 3.8   No results for input(s): LIPASE, AMYLASE in the last 168 hours. No results for input(s): AMMONIA in the last 168 hours. Coagulation Profile: No results for input(s): INR, PROTIME in the last 168 hours. Cardiac Enzymes: No results for input(s): CKTOTAL, CKMB, CKMBINDEX, TROPONINI in the last 168 hours. BNP (last 3 results) No results for input(s): PROBNP in the last 8760 hours. HbA1C: No results for input(s): HGBA1C in the last 72 hours. CBG: No results for input(s): GLUCAP in the last 168 hours. Lipid Profile: No results for input(s): CHOL, HDL, LDLCALC, TRIG, CHOLHDL, LDLDIRECT in the last 72 hours. Thyroid Function Tests: No results for input(s): TSH, T4TOTAL, FREET4, T3FREE, THYROIDAB in the last 72 hours. Anemia Panel: Recent Labs    11/10/19 2145 11/11/19 0510  FERRITIN 607* 665*   Sepsis Labs: Recent Labs  Lab 11/10/19 1904 11/10/19 2145  PROCALCITON <0.10 <0.10    No results found for this or any previous visit (from the past 240 hour(s)).       Radiology Studies: DG Chest 2 View  Result Date: 11/10/2019 CLINICAL DATA:  Chest pain, dyspnea, COVID pneumonia EXAM: CHEST - 2 VIEW COMPARISON:  10/08/2016 FINDINGS: The lungs are well expanded and are symmetric. There has developed minimal focal pulmonary infiltrate or  atelectasis within the a posterior basal right lower lobe. Lungs are otherwise clear. No pneumothorax or pleural effusion. Cardiac size within normal limits. No acute bone abnormality. IMPRESSION: Minimal focal pulmonary infiltrate or atelectasis within the posterior basal right lower lobe. Electronically Signed   By: Helyn Numbers MD   On: 11/10/2019 16:27   CT Angio Chest PE W and/or Wo Contrast  Result Date: 11/10/2019 CLINICAL DATA:  COVID-19 positive, negative D-dimer, hypoxic EXAM: CT ANGIOGRAPHY CHEST WITH CONTRAST TECHNIQUE: Multidetector CT imaging of the chest was performed using the standard protocol during bolus administration of intravenous contrast. Multiplanar CT image reconstructions and MIPs were obtained to evaluate the vascular anatomy. CONTRAST:  83mL OMNIPAQUE IOHEXOL 350 MG/ML SOLN COMPARISON:  Radiograph 11/10/2019 FINDINGS: Cardiovascular: Satisfactory opacification the pulmonary arteries to the segmental level. No pulmonary artery filling defects are identified. Pulmonary trunk is borderline enlarged. Normal  heart size. No pericardial effusion. The aorta is normal caliber. No acute luminal abnormality. No periaortic stranding or hemorrhage. Shared origin of brachiocephalic and left common carotid artery. Proximal great vessels otherwise unremarkable. Mediastinum/Nodes: Scattered subcentimeter low-attenuation mediastinal and hilar nodes favored to be reactive. No worrisome mediastinal, hilar or axillary adenopathy. No acute abnormality of the trachea or esophagus. Thyroid gland and thoracic inlet are unremarkable. Lungs/Pleura: Multifocal patchy areas of mixed consolidative and ground-glass opacity with more confluent opacity throughout the right lower lobe in a posterior, peripheral predominance. No pneumothorax. No effusion. Mild airways thickening. No convincing features of edema. Upper Abdomen: No acute abnormalities present in the visualized portions of the upper abdomen.  Musculoskeletal: No acute osseous abnormality or suspicious osseous lesion. Multilevel degenerative changes are present in the imaged portions of the spine. Mild degenerative changes in the shoulders as well. Review of the MIP images confirms the above findings. IMPRESSION: 1. No evidence of pulmonary embolism. 2. Borderline enlarged pulmonary trunk, can be seen with pulmonary arterial hypertension. 3. Multifocal patchy areas of mixed consolidative and ground-glass opacity with more confluent opacity throughout the right lower lobe. Findings are favored to represent multifocal pneumonia in the setting of COVID-19. Electronically Signed   By: Kreg Shropshire M.D.   On: 11/10/2019 20:03        Scheduled Meds:  vitamin C  500 mg Oral Daily   aspirin EC  81 mg Oral Daily   cholecalciferol  1,000 Units Oral Daily   dexamethasone (DECADRON) injection  6 mg Intravenous Q24H   enoxaparin (LOVENOX) injection  40 mg Subcutaneous Q24H   famotidine  20 mg Oral BID   guaiFENesin  600 mg Oral BID   zinc sulfate  220 mg Oral Daily   Continuous Infusions:  remdesivir 100 mg in NS 100 mL 100 mg (11/11/19 0847)     LOS: 1 day    Time spent: 25 minutes    Tresa Moore, MD [facility] Pager [phone]  If 7PM-7AM, please contact night-coverage 11/11/2019, 10:24 AM

## 2019-11-12 DIAGNOSIS — J9600 Acute respiratory failure, unspecified whether with hypoxia or hypercapnia: Secondary | ICD-10-CM

## 2019-11-12 LAB — FERRITIN: Ferritin: 628 ng/mL — ABNORMAL HIGH (ref 24–336)

## 2019-11-12 LAB — CBC WITH DIFFERENTIAL/PLATELET
Abs Immature Granulocytes: 0.02 10*3/uL (ref 0.00–0.07)
Basophils Absolute: 0 10*3/uL (ref 0.0–0.1)
Basophils Relative: 0 %
Eosinophils Absolute: 0 10*3/uL (ref 0.0–0.5)
Eosinophils Relative: 0 %
HCT: 41.1 % (ref 39.0–52.0)
Hemoglobin: 13.5 g/dL (ref 13.0–17.0)
Immature Granulocytes: 1 %
Lymphocytes Relative: 26 %
Lymphs Abs: 0.9 10*3/uL (ref 0.7–4.0)
MCH: 29.7 pg (ref 26.0–34.0)
MCHC: 32.8 g/dL (ref 30.0–36.0)
MCV: 90.5 fL (ref 80.0–100.0)
Monocytes Absolute: 0.4 10*3/uL (ref 0.1–1.0)
Monocytes Relative: 11 %
Neutro Abs: 2 10*3/uL (ref 1.7–7.7)
Neutrophils Relative %: 62 %
Platelets: 259 10*3/uL (ref 150–400)
RBC: 4.54 MIL/uL (ref 4.22–5.81)
RDW: 13.1 % (ref 11.5–15.5)
Smear Review: NORMAL
WBC: 3.2 10*3/uL — ABNORMAL LOW (ref 4.0–10.5)
nRBC: 0 % (ref 0.0–0.2)

## 2019-11-12 LAB — COMPREHENSIVE METABOLIC PANEL
ALT: 34 U/L (ref 0–44)
AST: 26 U/L (ref 15–41)
Albumin: 3.7 g/dL (ref 3.5–5.0)
Alkaline Phosphatase: 31 U/L — ABNORMAL LOW (ref 38–126)
Anion gap: 9 (ref 5–15)
BUN: 15 mg/dL (ref 6–20)
CO2: 25 mmol/L (ref 22–32)
Calcium: 8.9 mg/dL (ref 8.9–10.3)
Chloride: 103 mmol/L (ref 98–111)
Creatinine, Ser: 0.92 mg/dL (ref 0.61–1.24)
GFR calc Af Amer: >60 mL/min (ref >60)
GFR calc non Af Amer: >60 mL/min (ref >60)
Glucose, Bld: 134 mg/dL — ABNORMAL HIGH (ref 70–99)
Potassium: 4.4 mmol/L (ref 3.5–5.1)
Sodium: 137 mmol/L (ref 135–145)
Total Bilirubin: 0.7 mg/dL (ref 0.3–1.2)
Total Protein: 7.5 g/dL (ref 6.5–8.1)

## 2019-11-12 LAB — MAGNESIUM: Magnesium: 2.3 mg/dL (ref 1.7–2.4)

## 2019-11-12 LAB — FIBRIN DERIVATIVES D-DIMER (ARMC ONLY): Fibrin derivatives D-dimer (ARMC): 417.72 ng/mL (FEU) (ref 0.00–499.00)

## 2019-11-12 LAB — C-REACTIVE PROTEIN: CRP: 1 mg/dL — ABNORMAL HIGH (ref <1.0)

## 2019-11-12 MED ORDER — ASCORBIC ACID 500 MG PO TABS
500.0000 mg | ORAL_TABLET | Freq: Every day | ORAL | 0 refills | Status: DC
Start: 2019-11-12 — End: 2023-08-14

## 2019-11-12 MED ORDER — HYDROCOD POLST-CPM POLST ER 10-8 MG/5ML PO SUER
5.0000 mL | Freq: Two times a day (BID) | ORAL | 0 refills | Status: DC | PRN
Start: 2019-11-12 — End: 2023-08-14

## 2019-11-12 MED ORDER — ALBUTEROL SULFATE HFA 108 (90 BASE) MCG/ACT IN AERS: 2.0000 | INHALATION_SPRAY | Freq: Four times a day (QID) | RESPIRATORY_TRACT | 0 refills | Status: DC | PRN

## 2019-11-12 MED ORDER — VITAMIN D3 25 MCG PO TABS: 1000.0000 [IU] | ORAL_TABLET | Freq: Every day | ORAL | 0 refills | Status: DC

## 2019-11-12 MED ORDER — DEXAMETHASONE 6 MG PO TABS: 6.0000 mg | ORAL_TABLET | Freq: Every day | ORAL | 0 refills | Status: DC

## 2019-11-12 MED ORDER — ZINC SULFATE 220 (50 ZN) MG PO CAPS: 220.0000 mg | ORAL_CAPSULE | Freq: Every day | ORAL | 0 refills | Status: DC

## 2019-11-12 NOTE — Discharge Instructions (Signed)
Patient scheduled for outpatient Remdesivir infusions at 9am on Thursday 9/2 and Friday 9/3 at [facility]. Please inform the patient to park at [address] [place], [place], as staff will be escorting the patient through the east entrance of the hospital. The appointments will take approximately 45 minutes.    There is a wave flag banner located near the entrance on [address]. Turn into this entrance and immediately turn left and park in 1 of the 5 designated Covid Infusion Parking spots. There is a phone number on the sign, please call and let the staff know what spot you are in and we will come out and get you. For questions call [phone].  Thanks.    10 Things You Can Do to Manage Your COVID-19 Symptoms at Home If you have possible or confirmed COVID-19: 1. Stay home from work and school. And stay away from other public places. If you must go out, avoid using any kind of public transportation, ridesharing, or taxis. 2. Monitor your symptoms carefully. If your symptoms get worse, call your healthcare provider immediately. 3. Get rest and stay hydrated. 4. If you have a medical appointment, call the healthcare provider ahead of time and tell them that you have or may have COVID-19. 5. For medical emergencies, call 911 and notify the dispatch personnel that you have or may have COVID-19. 6. Cover your cough and sneezes with a tissue or use the inside of your elbow. 7. Wash your hands often with soap and water for at least 20 seconds or clean your hands with an alcohol-based hand sanitizer that contains at least 60% alcohol. 8. As much as possible, stay in a specific room and away from other people in your home. Also, you should use a separate bathroom, if available. If you need to be around other people in or outside of the home, wear a mask. 9. Avoid sharing personal items with other people in your household, like dishes, towels, and bedding. 10. Clean all surfaces that are touched  often, like counters, tabletops, and doorknobs. Use household cleaning sprays or wipes according to the label instructions. [employer] 09/11/2018 This information is not intended to replace advice given to you by your health care provider. Make sure you discuss any questions you have with your health care provider. Document Revised: 02/13/2019 Document Reviewed: 02/13/2019 Elsevier Patient Education  2020 Elsevier Inc.  COVID-19: How to Protect Yourself and Others Know how it spreads  There is currently no vaccine to prevent coronavirus disease 2019 (COVID-19).  The best way to prevent illness is to avoid being exposed to this virus.  The virus is thought to spread mainly from person-to-person. ? Between people who are in close contact with one another (within about 6 feet). ? Through respiratory droplets produced when an infected person coughs, sneezes or talks. ? These droplets can land in the mouths or noses of people who are nearby or possibly be inhaled into the lungs. ? COVID-19 may be spread by people who are not showing symptoms. Everyone should Clean your hands often  Wash your hands often with soap and water for at least 20 seconds especially after you have been in a public place, or after blowing your nose, coughing, or sneezing.  If soap and water are not readily available, use a hand sanitizer that contains at least 60% alcohol. Cover all surfaces of your hands and rub them together until they feel dry.  Avoid touching your eyes, nose, and mouth with  unwashed hands. Avoid close contact  Limit contact with others as much as possible.  Avoid close contact with people who are sick.  Put distance between yourself and other people. ? Remember that some people without symptoms may be able to spread virus. ? This is especially important for people who are at higher risk of getting very  [employer] Cover your mouth and nose with a mask when around others  You could spread COVID-19 to others even if you do not feel sick.  Everyone should wear a mask in public settings and when around people not living in their household, especially when social distancing is difficult to maintain. ? Masks should not be placed on young children under age 52, anyone who has trouble breathing, or is unconscious, incapacitated or otherwise unable to remove the mask without assistance.  The mask is meant to protect other people in case you are infected.  Do NOT use a facemask meant for a Research scientist (physical sciences).  Continue to keep about 6 feet between yourself and others. The mask is not a substitute for social distancing. Cover coughs and sneezes  Always cover your mouth and nose with a tissue when you cough or sneeze or use the inside of your elbow.  Throw used tissues in the trash.  Immediately wash your hands with soap and water for at least 20 seconds. If soap and water are not readily available, clean your hands with a hand sanitizer that contains at least 60% alcohol. Clean and disinfect  Clean AND disinfect frequently touched surfaces daily. This includes tables, doorknobs, light switches, countertops, handles, desks, phones, keyboards, toilets, faucets, and sinks. [employer]  If surfaces are dirty, clean them: Use detergent or soap and water prior to disinfection.  Then, use a household disinfectant. You can see a list of EPA-registered household disinfectants here. [employer] 11/13/2018 This information is not intended to replace advice given to you by your health care provider. Make sure you discuss any questions you have with your health care provider. Document Revised: 11/21/2018 Document Reviewed: 09/19/2018 Elsevier Patient  Education  2020 [employer].

## 2019-11-12 NOTE — Discharge Summary (Signed)
[facility] [place] at [facility]   PATIENT NAME: Philip Martinez    MR#:  [id number]  DATE OF BIRTH:  10/18/1990  DATE OF ADMISSION:  11/10/2019 ADMITTING PHYSICIAN: Hannah Beat, MD  DATE OF DISCHARGE: 11/12/2019  2:12 PM  PRIMARY CARE PHYSICIAN: Patient, No Pcp Per    ADMISSION DIAGNOSIS:  Hypoxia [R09.02] Pneumonia due to 2019 novel coronavirus [U07.1, J12.82] COVID-19 [U07.1]  DISCHARGE DIAGNOSIS:  Active Problems:   Pneumonia due to 2019 novel coronavirus   SECONDARY DIAGNOSIS:   Past Medical History:  Diagnosis Date  . Incomplete RBBB   . Panic attack     HOSPITAL COURSE:   1.  Acute hypoxic respiratory failure secondary to COVID-19 pneumonia.  The patient is off oxygen and saturating well.  He felt well enough to go home.  He received 3 days of remdesivir here in the hospital.  Was also on IV Solu-Medrol.  I messaged the remdesivir infusion personnel to set up outpatient remdesivir infusion for 11/13/2019 and 11/14/2019.  Will finish up 7 more days of steroid.  Continue vitamin C and zinc.  Inhaler prescribed.  CT scan negative for pulmonary embolism.  Patient felt a lot better than when he came in and was stable for discharge home.  DISCHARGE CONDITIONS:   Satisfactory  CONSULTS OBTAINED:  None  DRUG ALLERGIES:   Allergies  Allergen Reactions  . Sulfur     Hives     DISCHARGE MEDICATIONS:   Allergies as of 11/12/2019      Reactions   Sulfur    Hives      Medication List    STOP taking these medications   oxyCODONE-acetaminophen 5-325 MG tablet Commonly known as: Roxicet   tamsulosin 0.4 MG Caps capsule Commonly known as: Flomax     TAKE these medications   albuterol 108 (90 Base) MCG/ACT inhaler Commonly known as: VENTOLIN HFA Inhale 2 puffs into the lungs every 6 (six) hours as needed for wheezing or shortness of breath.   ascorbic acid 500 MG tablet Commonly known as: VITAMIN C Take 1 tablet (500 mg total) by mouth  daily.   chlorpheniramine-HYDROcodone 10-8 MG/5ML Suer Commonly known as: TUSSIONEX Take 5 mLs by mouth every 12 (twelve) hours as needed for cough.   dexamethasone 6 MG tablet Commonly known as: DECADRON Take 1 tablet (6 mg total) by mouth daily. Start taking on: November 13, 2019   Vitamin D3 25 MCG tablet Commonly known as: Vitamin D Take 1 tablet (1,000 Units total) by mouth daily.   zinc sulfate 220 (50 Zn) MG capsule Take 1 capsule (220 mg total) by mouth daily.        DISCHARGE INSTRUCTIONS:   Follow-up your medical doctor 2 weeks [facility]-up [facility] on 9/2 and 9/3 at 9 AM for remdesivir infusion.  If you experience worsening of your admission symptoms, develop shortness of breath, life threatening emergency, suicidal or homicidal thoughts you must seek medical attention immediately by calling 911 or calling your MD immediately  if symptoms less severe.  You Must read complete instructions/literature along with all the possible adverse reactions/side effects for all the Medicines you take and that have been prescribed to you. Take any new Medicines after you have completely understood and accept all the possible adverse reactions/side effects.   Please note  You were cared for by a hospitalist during your hospital stay. If you have any questions about your discharge medications or the care you received while you were  in the hospital after you are discharged, you can call the unit and asked to speak with the hospitalist on call if the hospitalist that took care of you is not available. Once you are discharged, your primary care physician will handle any further medical issues. Please note that NO REFILLS for any discharge medications will be authorized once you are discharged, as it is imperative that you return to your primary care physician (or establish a relationship with a primary care physician if you do not have one) for your aftercare needs so that they can  reassess your need for medications and monitor your lab values.    Today   CHIEF COMPLAINT:   Chief Complaint  Patient presents with  . Shortness of Breath    HISTORY OF PRESENT ILLNESS:  Philip Martinez  is a 29 y.o. male admitted with shortness of breath and found to have COVID-19 pneumonia   VITAL SIGNS:  Blood pressure 105/66, pulse 64, temperature 98.1 F (36.7 C), temperature source Oral, resp. rate 18, height 5\' 9"  (1.753 m), weight 90.7 kg, SpO2 92 %.  PHYSICAL EXAMINATION:  GENERAL:  29 y.o.-year-old patient lying in the bed with no acute distress.  EYES: Pupils equal, round, reactive to light and accommodation. No scleral icterus. Extraocular muscles intact.  HEENT: Head atraumatic, normocephalic. Oropharynx and nasopharynx clear.  LUNGS: Normal breath sounds bilaterally, no wheezing, rales,rhonchi or crepitation. No use of accessory muscles of respiration.  CARDIOVASCULAR: S1, S2 normal. No murmurs, rubs, or gallops.  ABDOMEN: Soft, non-tender, non-distended. Bowel sounds present. No organomegaly or mass.  EXTREMITIES: No pedal edema, cyanosis, or clubbing.  NEUROLOGIC: Cranial nerves II through XII are intact. Muscle strength 5/5 in all extremities. Sensation intact. Gait not checked.  PSYCHIATRIC: The patient is alert and oriented x 3.  SKIN: No obvious rash, lesion, or ulcer.   DATA REVIEW:   CBC Recent Labs  Lab 11/12/19 0625  WBC 3.2*  HGB 13.5  HCT 41.1  PLT 259    Chemistries  Recent Labs  Lab 11/12/19 0625  NA 137  K 4.4  CL 103  CO2 25  GLUCOSE 134*  BUN 15  CREATININE 0.92  CALCIUM 8.9  MG 2.3  AST 26  ALT 34  ALKPHOS 31*  BILITOT 0.7     RADIOLOGY:  CT Angio Chest PE W and/or Wo Contrast  Result Date: 11/10/2019 CLINICAL DATA:  COVID-19 positive, negative D-dimer, hypoxic EXAM: CT ANGIOGRAPHY CHEST WITH CONTRAST TECHNIQUE: Multidetector CT imaging of the chest was performed using the standard protocol during bolus  administration of intravenous contrast. Multiplanar CT image reconstructions and MIPs were obtained to evaluate the vascular anatomy. CONTRAST:  91mL OMNIPAQUE IOHEXOL 350 MG/ML SOLN COMPARISON:  Radiograph 11/10/2019 FINDINGS: Cardiovascular: Satisfactory opacification the pulmonary arteries to the segmental level. No pulmonary artery filling defects are identified. Pulmonary trunk is borderline enlarged. Normal heart size. No pericardial effusion. The aorta is normal caliber. No acute luminal abnormality. No periaortic stranding or hemorrhage. Shared origin of brachiocephalic and left common carotid artery. Proximal great vessels otherwise unremarkable. Mediastinum/Nodes: Scattered subcentimeter low-attenuation mediastinal and hilar nodes favored to be reactive. No worrisome mediastinal, hilar or axillary adenopathy. No acute abnormality of the trachea or esophagus. Thyroid gland and thoracic inlet are unremarkable. Lungs/Pleura: Multifocal patchy areas of mixed consolidative and ground-glass opacity with more confluent opacity throughout the right lower lobe in a posterior, peripheral predominance. No pneumothorax. No effusion. Mild airways thickening. No convincing features of edema. Upper Abdomen: No acute abnormalities  present in the visualized portions of the upper abdomen. Musculoskeletal: No acute osseous abnormality or suspicious osseous lesion. Multilevel degenerative changes are present in the imaged portions of the spine. Mild degenerative changes in the shoulders as well. Review of the MIP images confirms the above findings. IMPRESSION: 1. No evidence of pulmonary embolism. 2. Borderline enlarged pulmonary trunk, can be seen with pulmonary arterial hypertension. 3. Multifocal patchy areas of mixed consolidative and ground-glass opacity with more confluent opacity throughout the right lower lobe. Findings are favored to represent multifocal pneumonia in the setting of COVID-19. Electronically Signed    By: Kreg Shropshire M.D.   On: 11/10/2019 20:03      Management plans discussed with the patient, family and they are in agreement.  CODE STATUS:     Code Status Orders  (From admission, onward)         Start     Ordered   11/10/19 2058  Full code  Continuous        11/10/19 2059        Code Status History    This patient has a current code status but no historical code status.   Advance Care Planning Activity      TOTAL TIME TAKING CARE OF THIS PATIENT: 40 minutes, in coordination of care and setting up outpatient remdesivir infusions.Alford Highland M.D on 11/12/2019 at 4:23 PM  Between 7am to 6pm - Pager - [phone]  After 6pm go to [email or web address] - password [email or web address]  [facility]  CC: Primary care physician; Patient, No Pcp Per

## 2019-11-12 NOTE — Progress Notes (Signed)
Patient ID: Tana Conch [facility] [place] at [facility] was admitted to the Hospital on 11/10/2019 and Discharged  11/12/2019 and should be excused from work/school   for 14 days starting 11/10/2019 , may return to work/school without any restrictions.  Alford Highland M.D on 11/12/2019,at 9:14 AM  [facility] [place] at [facility]

## 2019-11-12 NOTE — Progress Notes (Signed)
Patient scheduled for outpatient Remdesivir infusions at 9am on Thursday 9/2 and Friday 9/3 at [facility]. Please inform the patient to park at [address] [place], [place], as staff will be escorting the patient through the east entrance of the hospital.    There is a wave flag banner located near the entrance on [address]. Turn into this entrance and immediately turn left and park in 1 of the 5 designated Covid Infusion Parking spots. There is a phone number on the sign, please call and let the staff know what spot you are in and we will come out and get you. For questions call [phone].  Thanks.

## 2019-11-13 ENCOUNTER — Ambulatory Visit (HOSPITAL_COMMUNITY)
Admit: 2019-11-13 | Discharge: 2019-11-13 | Disposition: A | Discharge status: Home / Self Care | Payer: BC Managed Care – PPO | Attending: Pulmonary Disease | Admitting: Pulmonary Disease

## 2019-11-13 DIAGNOSIS — J1282 Pneumonia due to coronavirus disease 2019: Secondary | ICD-10-CM | POA: Diagnosis not present

## 2019-11-13 DIAGNOSIS — U071 COVID-19: Secondary | ICD-10-CM | POA: Diagnosis present

## 2019-11-13 MED ORDER — SODIUM CHLORIDE 0.9 % IV SOLN
100.0000 mg | Freq: Once | INTRAVENOUS | Status: AC
  Administered 2019-11-13: 100 mg via INTRAVENOUS
  Filled 2019-11-13: qty 20

## 2019-11-13 MED ORDER — FAMOTIDINE IN NACL 20-0.9 MG/50ML-% IV SOLN: 20.0000 mg | Freq: Once | INTRAVENOUS | Status: DC | PRN

## 2019-11-13 MED ORDER — EPINEPHRINE 0.3 MG/0.3ML IJ SOAJ: 0.3000 mg | Freq: Once | INTRAMUSCULAR | Status: DC | PRN

## 2019-11-13 MED ORDER — SODIUM CHLORIDE 0.9 % IV SOLN: INTRAVENOUS | Status: DC | PRN

## 2019-11-13 MED ORDER — ALBUTEROL SULFATE HFA 108 (90 BASE) MCG/ACT IN AERS: 2.0000 | INHALATION_SPRAY | Freq: Once | RESPIRATORY_TRACT | Status: DC | PRN

## 2019-11-13 MED ORDER — METHYLPREDNISOLONE SODIUM SUCC 125 MG IJ SOLR: 125.0000 mg | Freq: Once | INTRAMUSCULAR | Status: DC | PRN

## 2019-11-13 MED ORDER — DIPHENHYDRAMINE HCL 50 MG/ML IJ SOLN: 50.0000 mg | Freq: Once | INTRAMUSCULAR | Status: DC | PRN

## 2019-11-13 NOTE — Progress Notes (Signed)
  Diagnosis: COVID-19  Physician: Dr. Delford Field  Procedure: Covid Infusion Clinic Med: remdesivir infusion - Provided patient with remdesivir fact sheet for patients, parents and caregivers prior to infusion.  Complications: No immediate complications noted.  Discharge: Discharged home   Maree Erie [facility] 11/13/2019

## 2019-11-14 ENCOUNTER — Ambulatory Visit (HOSPITAL_COMMUNITY)
Admit: 2019-11-14 | Discharge: 2019-11-14 | Disposition: A | Discharge status: Home / Self Care | Payer: BC Managed Care – PPO | Attending: Pulmonary Disease | Admitting: Pulmonary Disease

## 2019-11-14 DIAGNOSIS — U071 COVID-19: Secondary | ICD-10-CM | POA: Diagnosis not present

## 2019-11-14 MED ORDER — ALBUTEROL SULFATE HFA 108 (90 BASE) MCG/ACT IN AERS: 2.0000 | INHALATION_SPRAY | Freq: Once | RESPIRATORY_TRACT | Status: DC | PRN

## 2019-11-14 MED ORDER — FAMOTIDINE IN NACL 20-0.9 MG/50ML-% IV SOLN: 20.0000 mg | Freq: Once | INTRAVENOUS | Status: DC | PRN

## 2019-11-14 MED ORDER — EPINEPHRINE 0.3 MG/0.3ML IJ SOAJ: 0.3000 mg | Freq: Once | INTRAMUSCULAR | Status: DC | PRN

## 2019-11-14 MED ORDER — METHYLPREDNISOLONE SODIUM SUCC 125 MG IJ SOLR: 125.0000 mg | Freq: Once | INTRAMUSCULAR | Status: DC | PRN

## 2019-11-14 MED ORDER — SODIUM CHLORIDE 0.9 % IV SOLN: INTRAVENOUS | Status: DC | PRN

## 2019-11-14 MED ORDER — DIPHENHYDRAMINE HCL 50 MG/ML IJ SOLN: 50.0000 mg | Freq: Once | INTRAMUSCULAR | Status: DC | PRN

## 2019-11-14 MED ORDER — SODIUM CHLORIDE 0.9 % IV SOLN
100.0000 mg | Freq: Once | INTRAVENOUS | Status: AC
  Administered 2019-11-14: 100 mg via INTRAVENOUS
  Filled 2019-11-14: qty 20

## 2019-11-14 NOTE — Discharge Instructions (Signed)
10 Things You Can Do to Manage Your COVID-19 Symptoms at Home If you have possible or confirmed COVID-19: 1. Stay home from work and school. And stay away from other public places. If you must go out, avoid using any kind of public transportation, ridesharing, or taxis. 2. Monitor your symptoms carefully. If your symptoms get worse, call your healthcare provider immediately. 3. Get rest and stay hydrated. 4. If you have a medical appointment, call the healthcare provider ahead of time and tell them that you have or may have COVID-19. 5. For medical emergencies, call 911 and notify the dispatch personnel that you have or may have COVID-19. 6. Cover your cough and sneezes with a tissue or use the inside of your elbow. 7. Wash your hands often with soap and water for at least 20 seconds or clean your hands with an alcohol-based hand sanitizer that contains at least 60% alcohol. 8. As much as possible, stay in a specific room and away from other people in your home. Also, you should use a separate bathroom, if available. If you need to be around other people in or outside of the home, wear a mask. 9. Avoid sharing personal items with other people in your household, like dishes, towels, and bedding. 10. Clean all surfaces that are touched often, like counters, tabletops, and doorknobs. Use household cleaning sprays or wipes according to the label instructions. cdc.gov/coronavirus 09/11/2018 This information is not intended to replace advice given to you by your health care provider. Make sure you discuss any questions you have with your health care provider. Document Revised: 02/13/2019 Document Reviewed: 02/13/2019 Elsevier Patient Education  2020 Elsevier Inc.  

## 2019-11-14 NOTE — Progress Notes (Signed)
  Diagnosis: COVID-19  Physician:Dr. Patrick Wright  Procedure: Cov[facility] Infusion Clinic Med: remdesivir infusion - Provided patient with remdesivir fact sheet for patients, parents and caregivers prior to infusion.  Complications: No immediate complications noted.  Discharge: Discharged home   Philip Martinez [name] 11/14/2019   

## 2020-04-14 DIAGNOSIS — E7800 Pure hypercholesterolemia, unspecified: Secondary | ICD-10-CM | POA: Insufficient documentation

## 2022-01-18 IMAGING — CR DG CHEST 2V
1 series · 2 of 2 positions shown · non-contrast
Comparison: 10/08/2016

CLINICAL DATA: Chest pain, dyspnea, COVID pneumonia

EXAM:
CHEST - 2 VIEW

[Series 1: dg chest 2 view · 0.14mm/px · 2 of 2 slices shown]
[im 1/2]
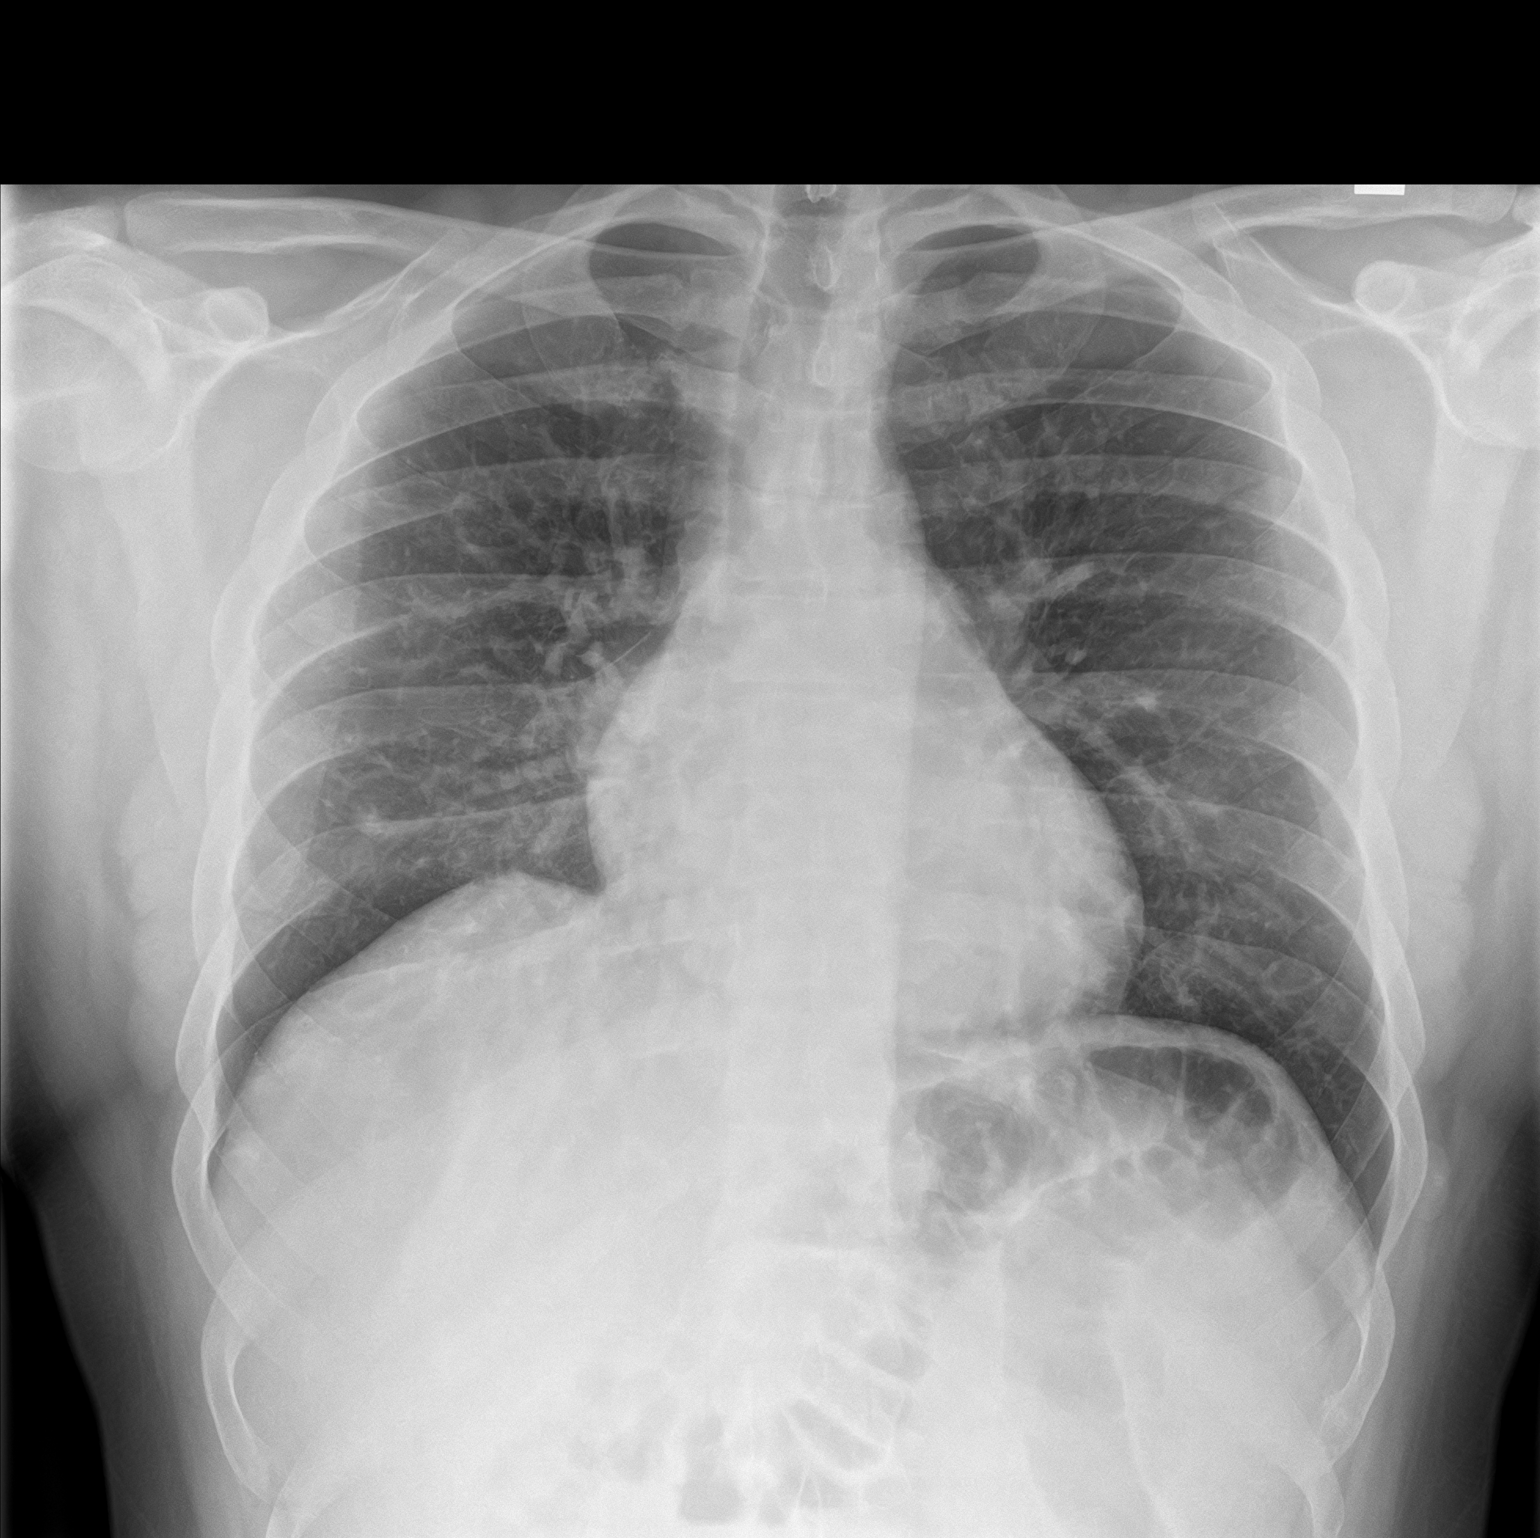
[im 2/2]
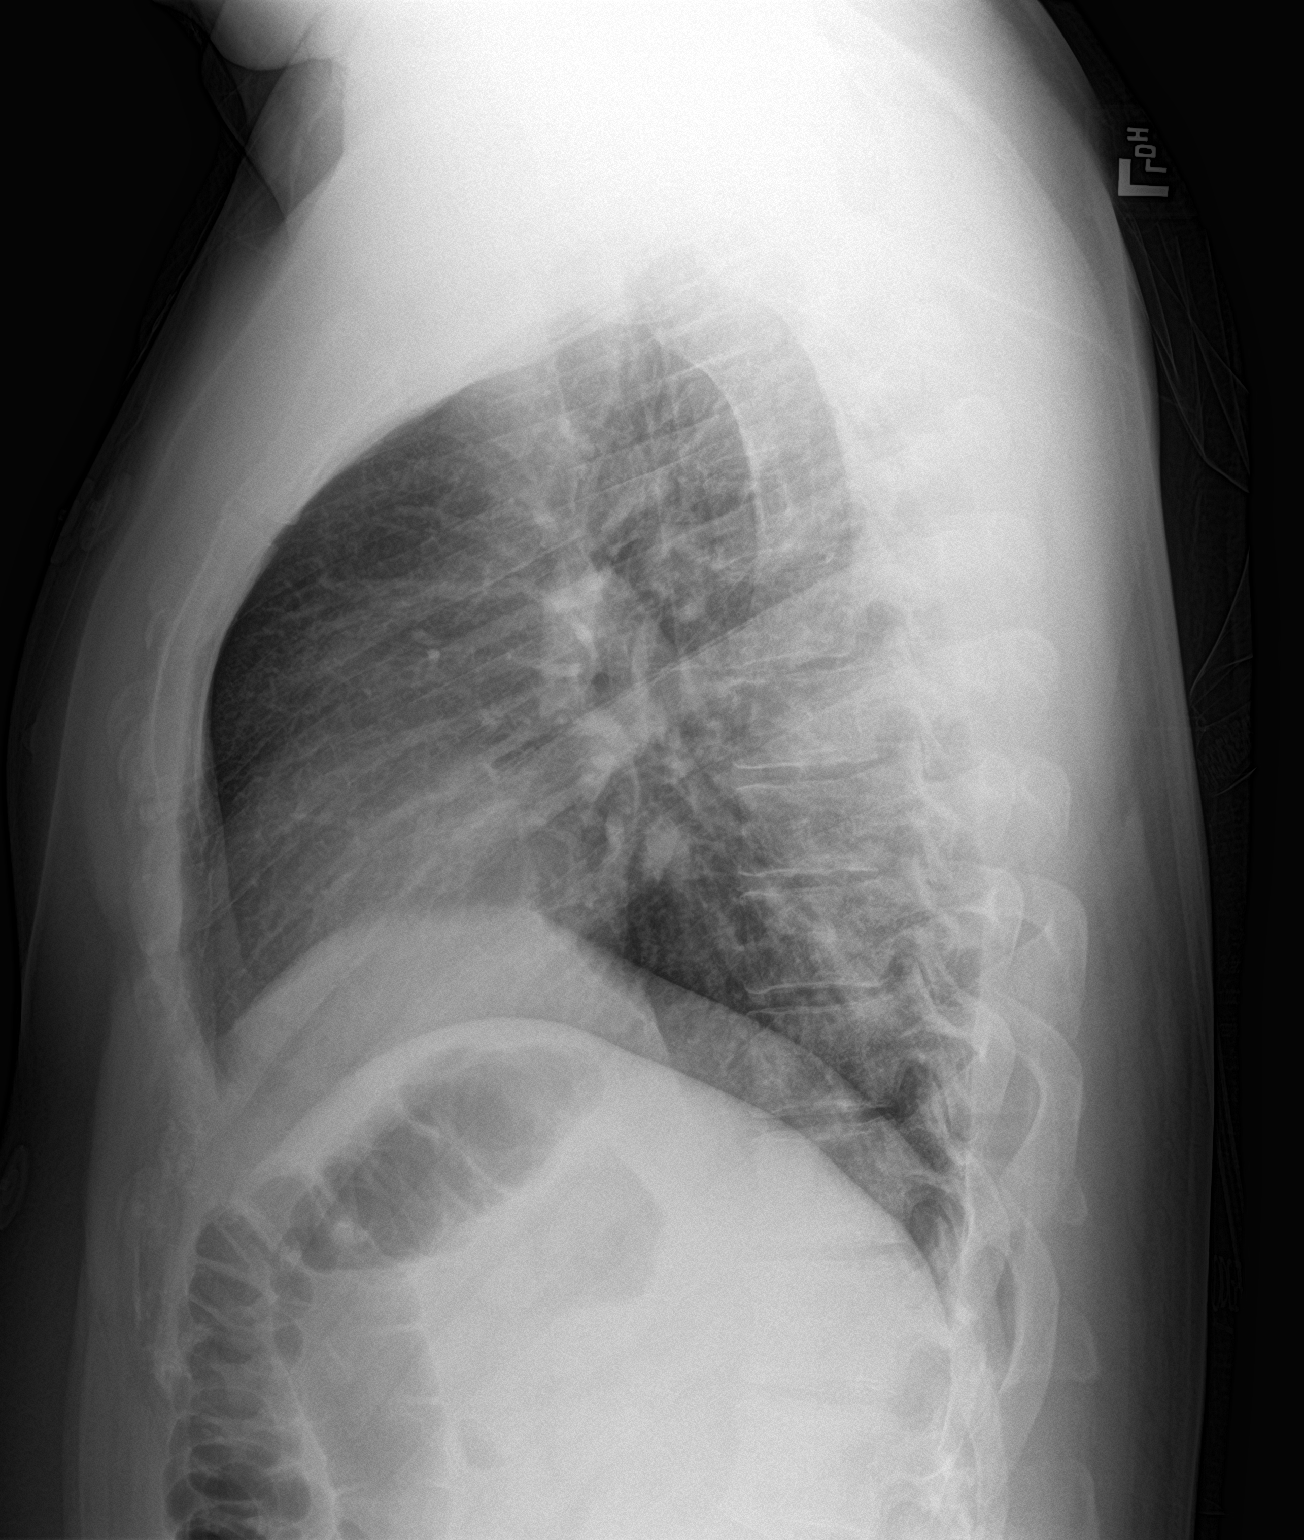

[2 of 2 positions shown; findings below may reference images not displayed]

FINDINGS: The lungs are well expanded and are symmetric. There has developed
minimal focal pulmonary infiltrate or atelectasis within the a
posterior basal right lower lobe. Lungs are otherwise clear. No
pneumothorax or pleural effusion. Cardiac size within normal limits.
No acute bone abnormality.
IMPRESSION: Minimal focal pulmonary infiltrate or atelectasis within the
posterior basal right lower lobe.

## 2023-08-14 ENCOUNTER — Ambulatory Visit (HOSPITAL_BASED_OUTPATIENT_CLINIC_OR_DEPARTMENT_OTHER): Admitting: Family Medicine

## 2023-08-14 ENCOUNTER — Encounter (HOSPITAL_BASED_OUTPATIENT_CLINIC_OR_DEPARTMENT_OTHER): Payer: Self-pay | Admitting: Family Medicine

## 2023-08-14 VITALS — BP 137/92 | HR 79 | Ht 69.0 in | Wt 240.0 lb

## 2023-08-14 DIAGNOSIS — E782 Mixed hyperlipidemia: Secondary | ICD-10-CM | POA: Diagnosis not present

## 2023-08-14 DIAGNOSIS — Z833 Family history of diabetes mellitus: Secondary | ICD-10-CM

## 2023-08-14 DIAGNOSIS — R030 Elevated blood-pressure reading, without diagnosis of hypertension: Secondary | ICD-10-CM | POA: Diagnosis not present
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
## 2023-08-14 NOTE — Patient Instructions (Signed)
 Please return this week for labwork.  Please return for fasting blood work.  For fasting, if your blood work is in the morning please do not eat any food after midnight.  You may have water or black coffee prior to your lab work.  Please take all regularly prescribed medications even if you are fasting.  If your blood work is in the afternoon, please fast for at least 5 to 6 hours.  You may continue to drink water and/or black coffee prior to your lab work.  Please take all scheduled medications even if you are fasting. Lab open: 8-12, 1pm-5pm.    Continue ot monitor your blood pressure at home. Goal is less than 130/80. Reach out to office if consistently elevated.

## 2023-08-15 DIAGNOSIS — R030 Elevated blood-pressure reading, without diagnosis of hypertension: Secondary | ICD-10-CM | POA: Insufficient documentation

## 2023-08-15 DIAGNOSIS — E782 Mixed hyperlipidemia: Secondary | ICD-10-CM | POA: Insufficient documentation

## 2023-08-15 DIAGNOSIS — Z833 Family history of diabetes mellitus: Secondary | ICD-10-CM | POA: Insufficient documentation

## 2023-08-16 ENCOUNTER — Other Ambulatory Visit (HOSPITAL_BASED_OUTPATIENT_CLINIC_OR_DEPARTMENT_OTHER): Payer: Self-pay | Admitting: *Deleted

## 2023-08-16 ENCOUNTER — Other Ambulatory Visit (HOSPITAL_COMMUNITY): Payer: Self-pay | Admitting: Family Medicine

## 2023-08-16 DIAGNOSIS — E782 Mixed hyperlipidemia: Secondary | ICD-10-CM

## 2023-08-16 DIAGNOSIS — R030 Elevated blood-pressure reading, without diagnosis of hypertension: Secondary | ICD-10-CM

## 2023-08-16 DIAGNOSIS — Z833 Family history of diabetes mellitus: Secondary | ICD-10-CM

## 2023-08-18 LAB — COMPREHENSIVE METABOLIC PANEL WITH GFR
ALT: 28 IU/L (ref 0–44)
AST: 27 IU/L (ref 0–40)
Albumin: 4.7 g/dL (ref 4.1–5.1)
Alkaline Phosphatase: 59 IU/L (ref 44–121)
BUN/Creatinine Ratio: 15 (ref 9–20)
BUN: 16 mg/dL (ref 6–20)
Bilirubin Total: 0.5 mg/dL (ref 0.0–1.2)
CO2: 17 mmol/L — ABNORMAL LOW (ref 20–29)
Calcium: 9.4 mg/dL (ref 8.7–10.2)
Chloride: 100 mmol/L (ref 96–106)
Creatinine, Ser: 1.05 mg/dL (ref 0.76–1.27)
Globulin, Total: 2.8 g/dL (ref 1.5–4.5)
Glucose: 95 mg/dL (ref 70–99)
Potassium: 4.2 mmol/L (ref 3.5–5.2)
Sodium: 140 mmol/L (ref 134–144)
Total Protein: 7.5 g/dL (ref 6.0–8.5)
eGFR: 97 mL/min/{1.73_m2} (ref >59)

## 2023-08-18 LAB — LIPID PANEL
Chol/HDL Ratio: 7.1 ratio — ABNORMAL HIGH (ref 0.0–5.0)
Cholesterol, Total: 321 mg/dL — ABNORMAL HIGH (ref 100–199)
HDL: 45 mg/dL (ref >39)
LDL Chol Calc (NIH): 200 mg/dL — ABNORMAL HIGH (ref 0–99)
Triglycerides: 370 mg/dL — ABNORMAL HIGH (ref 0–149)
VLDL Cholesterol Cal: 76 mg/dL — ABNORMAL HIGH (ref 5–40)

## 2023-08-18 LAB — HEMOGLOBIN A1C
Est. average glucose Bld gHb Est-mCnc: 114 mg/dL
Hgb A1c MFr Bld: 5.6 % (ref 4.8–5.6)

## 2023-08-18 LAB — TSH: TSH: 2.84 u[IU]/mL (ref 0.450–4.500)

## 2023-08-20 ENCOUNTER — Ambulatory Visit (HOSPITAL_BASED_OUTPATIENT_CLINIC_OR_DEPARTMENT_OTHER): Payer: Self-pay | Admitting: Family Medicine

## 2023-08-20 ENCOUNTER — Other Ambulatory Visit (HOSPITAL_BASED_OUTPATIENT_CLINIC_OR_DEPARTMENT_OTHER): Payer: Self-pay | Admitting: Family Medicine

## 2023-08-20 DIAGNOSIS — I10 Essential (primary) hypertension: Secondary | ICD-10-CM

## 2023-08-20 DIAGNOSIS — E782 Mixed hyperlipidemia: Secondary | ICD-10-CM

## 2023-08-20 MED ORDER — LOSARTAN POTASSIUM 25 MG PO TABS: 12.5000 mg | ORAL_TABLET | Freq: Every day | ORAL | 3 refills | Status: DC

## 2023-08-20 MED ORDER — ATORVASTATIN CALCIUM 40 MG PO TABS: 40.0000 mg | ORAL_TABLET | Freq: Every day | ORAL | 2 refills | Status: AC

## 2023-08-20 NOTE — Progress Notes (Signed)
 Please schedule him for a lab visit a few days prior to next app 11/14/23.   Hi Brysyn,  Your cholesterol is very uncontrolled as expected given your history of elevated cholesterol in the past. I have sent in your Lipitor 40mg  to start taking at bedtime nightly. Please continue a heart healthy diet and regular exercise. Your A1C (test for diabetes) was on the border of prediabetes. Your thyroid, electrolytes, kidney and liver function was normal. Please monitor your intake of fats, carbohydrates and sugars. We will recheck your levels in 3 months at your appt in September.

## 2023-09-25 ENCOUNTER — Encounter (HOSPITAL_BASED_OUTPATIENT_CLINIC_OR_DEPARTMENT_OTHER): Payer: Self-pay | Admitting: Family Medicine

## 2023-09-25 ENCOUNTER — Other Ambulatory Visit (HOSPITAL_BASED_OUTPATIENT_CLINIC_OR_DEPARTMENT_OTHER): Payer: Self-pay | Admitting: Family Medicine

## 2023-09-25 DIAGNOSIS — I10 Essential (primary) hypertension: Secondary | ICD-10-CM

## 2023-09-25 MED ORDER — LOSARTAN POTASSIUM 25 MG PO TABS: 25.0000 mg | ORAL_TABLET | Freq: Every day | ORAL | 3 refills | Status: DC

## 2023-09-25 NOTE — Telephone Encounter (Signed)
 Please see mychart message sent by pt and advise.

## 2023-11-09 ENCOUNTER — Other Ambulatory Visit (HOSPITAL_BASED_OUTPATIENT_CLINIC_OR_DEPARTMENT_OTHER): Payer: Self-pay | Admitting: Family Medicine

## 2023-11-09 DIAGNOSIS — E782 Mixed hyperlipidemia: Secondary | ICD-10-CM

## 2023-11-10 LAB — COMPREHENSIVE METABOLIC PANEL WITH GFR
ALT: 29 IU/L (ref 0–44)
AST: 24 IU/L (ref 0–40)
Albumin: 4.7 g/dL (ref 4.1–5.1)
Alkaline Phosphatase: 59 IU/L (ref 44–121)
BUN/Creatinine Ratio: 15 (ref 9–20)
BUN: 13 mg/dL (ref 6–20)
Bilirubin Total: 0.6 mg/dL (ref 0.0–1.2)
CO2: 23 mmol/L (ref 20–29)
Calcium: 9.6 mg/dL (ref 8.7–10.2)
Chloride: 99 mmol/L (ref 96–106)
Creatinine, Ser: 0.89 mg/dL (ref 0.76–1.27)
Globulin, Total: 2.9 g/dL (ref 1.5–4.5)
Glucose: 90 mg/dL (ref 70–99)
Potassium: 4.2 mmol/L (ref 3.5–5.2)
Sodium: 138 mmol/L (ref 134–144)
Total Protein: 7.6 g/dL (ref 6.0–8.5)
eGFR: 116 mL/min/1.73 (ref >59)

## 2023-11-10 LAB — LIPID PANEL
Chol/HDL Ratio: 5 ratio (ref 0.0–5.0)
Cholesterol, Total: 225 mg/dL — ABNORMAL HIGH (ref 100–199)
HDL: 45 mg/dL (ref >39)
LDL Chol Calc (NIH): 98 mg/dL (ref 0–99)
Triglycerides: 493 mg/dL — ABNORMAL HIGH (ref 0–149)
VLDL Cholesterol Cal: 82 mg/dL — ABNORMAL HIGH (ref 5–40)

## 2023-11-14 ENCOUNTER — Ambulatory Visit (INDEPENDENT_AMBULATORY_CARE_PROVIDER_SITE_OTHER): Admitting: Family Medicine

## 2023-11-14 ENCOUNTER — Encounter (HOSPITAL_BASED_OUTPATIENT_CLINIC_OR_DEPARTMENT_OTHER): Payer: Self-pay | Admitting: Family Medicine

## 2023-11-14 VITALS — BP 126/83 | HR 92 | Ht 69.0 in | Wt 253.0 lb

## 2023-11-14 DIAGNOSIS — E782 Mixed hyperlipidemia: Secondary | ICD-10-CM

## 2023-11-14 DIAGNOSIS — Z0000 Encounter for general adult medical examination without abnormal findings: Secondary | ICD-10-CM | POA: Diagnosis not present

## 2023-11-14 DIAGNOSIS — E781 Pure hyperglyceridemia: Secondary | ICD-10-CM | POA: Insufficient documentation

## 2023-11-14 DIAGNOSIS — I10 Essential (primary) hypertension: Secondary | ICD-10-CM | POA: Insufficient documentation

## 2023-11-14 NOTE — Progress Notes (Signed)
 Subjective:   Philip Martinez 01/31/1991 11/14/2023  CC: Chief Complaint  Patient presents with   Annual Exam    Patient is here today for his physical. Denies any concerns for today's visit other than states he has noticed at night that his BP has been elevated but denies any real symptoms including headache or dizziness.    HPI: Philip Martinez is a 33 y.o. male who presents for a routine health maintenance exam.  Labs collected at time of visit.   HYPERLIPIDEMIA: Philip Martinez presents for the medical management of hyperlipidemia.  Patient's current HLD regimen is: Atorvastatin  40mg  nightly. Patient does take Omega 3 fish oil inconsistently per patient.  Patient is  currently taking prescribed medications for HLD.  Adhering to heathy diet: Yes Exercising regularly: Yes Denies myalgias.  Lab Results  Component Value Date   CHOL 225 (H) 11/09/2023   HDL 45 11/09/2023   LDLCALC 98 11/09/2023   TRIG 493 (H) 11/09/2023   CHOLHDL 5.0 11/09/2023    HEALTH SCREENINGS: - Vision Screening: Recommended and will schedule - Dental Visits: Recommended - Testicular Exam: Declined - STD Screening: Declined - PSA (50+): Not applicable  No results found for: PSA1, PSA   - Colonoscopy (45+): Not applicable  Discussed with patient purpose of the colonoscopy is to detect colon cancer at curable precancerous or early stages  - AAA Screening: Not applicable  Men age 80-75 who have ever smoked - Lung Cancer screening with low-dose CT: Not applicable-  Adults age 51-80 who are current cigarette smokers or quit within the last 15 years. Must have 20 pack year history.   Depression and Anxiety Screen done today and results listed below:     11/14/2023    3:26 PM 08/14/2023    4:03 PM  Depression screen PHQ 2/9  Decreased Interest 0 0  Down, Depressed, Hopeless 0 0  PHQ - 2 Score 0 0  Altered sleeping 3 1  Tired, decreased energy 3 1  Change in appetite 0 0  Feeling bad or  failure about yourself  0 0  Trouble concentrating 0 0  Moving slowly or fidgety/restless 0 0  Suicidal thoughts 0 0  PHQ-9 Score 6 2  Difficult doing work/chores Not difficult at all Not difficult at all      11/14/2023    3:26 PM 08/14/2023    4:03 PM  GAD 7 : Generalized Anxiety Score  Nervous, Anxious, on Edge 0 2  Control/stop worrying 0 2  Worry too much - different things 0 2  Trouble relaxing 0 1  Restless 0 3  Easily annoyed or irritable 0 0  Afraid - awful might happen 0 1  Total GAD 7 Score 0 11  Anxiety Difficulty Not difficult at all Not difficult at all    IMMUNIZATIONS:  - Tdap: Tetanus vaccination status reviewed: Declined. - Influenza: Refused - Pneumovax: Not applicable - Prevnar: Not applicable - Shingrix vaccine (50+): Not applicable   Past medical history, surgical history, medications, allergies, family history and social history reviewed with patient today and changes made to appropriate areas of the chart.   Past Medical History:  Diagnosis Date   GERD (gastroesophageal reflux disease)    Hypertension    Incomplete RBBB    Panic attack    Renal colic 01/28/2013   Ureteric stone 01/28/2013    History reviewed. No pertinent surgical history.  Current Outpatient Medications on File Prior to Visit  Medication Sig   atorvastatin  (LIPITOR) 40 MG  tablet Take 1 tablet (40 mg total) by mouth at bedtime.   losartan  (COZAAR ) 25 MG tablet Take 1 tablet (25 mg total) by mouth daily.   No current facility-administered medications on file prior to visit.    Allergies  Allergen Reactions   Elemental Sulfur     Hives      Social History   Socioeconomic History   Marital status: Single    Spouse name: Not on file   Number of children: Not on file   Years of education: Not on file   Highest education level: Associate degree: academic program  Occupational History   Not on file  Tobacco Use   Smoking status: Some Days    Types: E-cigarettes    Smokeless tobacco: Never  Substance and Sexual Activity   Alcohol use: Yes    Alcohol/week: 8.0 standard drinks of alcohol    Types: 8 Cans of beer per week    Comment: occ   Drug use: No   Sexual activity: Yes    Birth control/protection: None  Other Topics Concern   Not on file  Social History Narrative   Not on file   Social Drivers of Health   Financial Resource Strain: Low Risk  (08/13/2023)   Overall Financial Resource Strain (CARDIA)    Difficulty of Paying Living Expenses: Not hard at all  Food Insecurity: No Food Insecurity (08/13/2023)   Hunger Vital Sign    Worried About Running Out of Food in the Last Year: Never true    Ran Out of Food in the Last Year: Never true  Transportation Needs: No Transportation Needs (08/13/2023)   PRAPARE - Administrator, Civil Service (Medical): No    Lack of Transportation (Non-Medical): No  Physical Activity: Sufficiently Active (08/13/2023)   Exercise Vital Sign    Days of Exercise per Week: 5 days    Minutes of Exercise per Session: 60 min  Stress: Stress Concern Present (08/13/2023)   [employer] of Occupational Health - Occupational Stress Questionnaire    Feeling of Stress : Very much  Social Connections: Moderately Isolated (08/13/2023)   Social Connection and Isolation Panel    Frequency of Communication with Friends and Family: More than three times a week    Frequency of Social Gatherings with Friends and Family: Twice a week    Attends Religious Services: Never    Database administrator or Organizations: No    Attends Engineer, structural: Not on file    Marital Status: Married  Catering manager Violence: Not on file   Social History   Tobacco Use  Smoking Status Some Days   Types: E-cigarettes  Smokeless Tobacco Never   Social History   Substance and Sexual Activity  Alcohol Use Yes   Alcohol/week: 8.0 standard drinks of alcohol   Types: 8 Cans of beer per week   Comment: occ     Family  History  Problem Relation Age of Onset   Hypertension Mother    Diabetes Mother    Depression Brother    Heart failure Paternal Grandmother    Heart disease Maternal Grandmother      ROS: Denies fever, fatigue, unexplained weight loss/gain, CP, SHOB, and palpatitations. Denies neurological deficits, gastrointestinal and/or genitourinary complaints, and skin changes.   Objective:   Today's Vitals   11/14/23 1524  BP: 126/83  Pulse: 92  Weight: 253 lb (114.8 kg)  Height: 5' 9 (1.753 m)  PF: 98 L/min  GENERAL APPEARANCE: Well-appearing, in NAD. Well nourished.  SKIN: Pink, warm and dry. Turgor normal. No rash, lesion, ulceration, or ecchymoses. Hair evenly distributed.  HEENT: HEAD: Normocephalic.  EYES: PERRLA. EOMI. Lids intact w/o defect. Sclera white, Conjunctiva pink w/o exudate.  EARS: External ear w/o redness, swelling, masses or lesions. EAC clear. TM's intact, translucent w/o bulging, appropriate landmarks visualized. Appropriate acuity to conversational tones.  NOSE: Septum midline w/o deformity. Nares patent, mucosa pink and non-inflamed w/o drainage. No sinus tenderness.  THROAT: Uvula midline. Oropharynx clear. Tonsils non-inflamed w/o exudate. Oral mucosa pink and moist.  NECK: Supple, Trachea midline. Full ROM w/o pain or tenderness. No lymphadenopathy. Thyroid non-tender w/o enlargement or palpable masses.  RESPIRATORY: Chest wall symmetrical w/o masses. Respirations even and non-labored. Breath sounds clear to auscultation bilaterally. No wheezes, rales, rhonchi, or crackles. CARDIAC: S1, S2 present, regular rate and rhythm. No gallops, murmurs, rubs, or clicks. PMI w/o lifts, heaves, or thrills. No carotid bruits. Capillary refill <2 seconds. Peripheral pulses 2+ bilaterally. GI: Abdomen soft w/o distention. Normoactive bowel sounds. No palpable masses or tenderness. No guarding or rebound tenderness. Liver and spleen w/o tenderness or enlargement. No CVA  tenderness.  GU: Pt deferred exam. MSK: Muscle tone and strength appropriate for age, w/o atrophy or abnormal movement. EXTREMITIES: Active ROM intact, w/o tenderness, crepitus, or contracture. No obvious joint deformities or effusions. No clubbing, edema, or cyanosis.  NEUROLOGIC: CN's II-XII intact. Motor strength symmetrical with no obvious weakness. No sensory deficits. DTR 2+ symmetric bilaterally. Steady, even gait.  PSYCH/MENTAL STATUS: Alert, oriented x 3. Cooperative, appropriate mood and affect.   Results for orders placed or performed in visit on 11/09/23  Lipid panel   Collection Time: 11/09/23 10:52 AM  Result Value Ref Range   Cholesterol, Total 225 (H) 100 - 199 mg/dL   Triglycerides 506 (H) 0 - 149 mg/dL   HDL 45 >60 mg/dL   VLDL Cholesterol Cal 82 (H) 5 - 40 mg/dL   LDL Chol Calc (NIH) 98 0 - 99 mg/dL   Chol/HDL Ratio 5.0 0.0 - 5.0 ratio  Comprehensive metabolic panel with GFR   Collection Time: 11/09/23 10:52 AM  Result Value Ref Range   Glucose 90 70 - 99 mg/dL   BUN 13 6 - 20 mg/dL   Creatinine, Ser 9.10 0.76 - 1.27 mg/dL   eGFR 883 >40 fO/fpw/8.26   BUN/Creatinine Ratio 15 9 - 20   Sodium 138 134 - 144 mmol/L   Potassium 4.2 3.5 - 5.2 mmol/L   Chloride 99 96 - 106 mmol/L   CO2 23 20 - 29 mmol/L   Calcium  9.6 8.7 - 10.2 mg/dL   Total Protein 7.6 6.0 - 8.5 g/dL   Albumin 4.7 4.1 - 5.1 g/dL   Globulin, Total 2.9 1.5 - 4.5 g/dL   Bilirubin Total 0.6 0.0 - 1.2 mg/dL   Alkaline Phosphatase 59 44 - 121 IU/L   AST 24 0 - 40 IU/L   ALT 29 0 - 44 IU/L    Assessment & Plan:  1. Annual physical exam (Primary) Discussed preventative screenings, vaccines, and healthy lifestyle with patient. Recommend dermatology evaluation for full body skin check for moles.   2. Mixed hyperlipidemia 3. Hypertriglyceridemia LDL improved greatly with statin increase and patient is tolerating well. Discussed concern of hypertriglyceridemia and recommend starting Omega 3 fish oil 2  grams twice daily. Will return in 3 months for LP. A1C improved and patient is without signs of pancreatitis or DM.  - Lipid  panel; Future  4. Primary hypertension Controlled. Continue current regimen. Labs reviewed.     Orders Placed This Encounter  Procedures   Lipid panel    Standing Status:   Future    Expected Date:   02/13/2024    Expiration Date:   03/15/2024    PATIENT COUNSELING: - Encouraged to adjust caloric intake to maintain or achieve ideal body weight, to reduce intake of dietary saturated fat and total fat, to limit sodium intake by avoiding high sodium foods and not adding table salt, and to maintain adequate dietary potassium and calcium  preferably from fresh fruits, vegetables, and low-fat dairy products.   - Advised to avoid cigarette smoking. - Discussed with the patient that most people either abstain from alcohol or drink within safe limits (<=14/week and <=4 drinks/occasion for males, <=7/weeks and <= 3 drinks/occasion for females) and that the risk for alcohol disorders and other health effects rises proportionally with the number of drinks per week and how often a drinker exceeds daily limits. - Discussed cessation/primary prevention of drug use and availability of treatment for abuse.   - Stressed the importance of regular exercise - Injury prevention: Discussed safety belts, safety helmets, smoke detector, smoking near bedding or upholstery.  - Dental health: Discussed importance of regular tooth brushing, flossing, and dental visits.  - Sexuality: Discussed sexually transmitted diseases, partner selection, use of condoms, avoidance of unintended pregnancy  and contraceptive alternatives.   NEXT PREVENTATIVE PHYSICAL DUE IN 1 YEAR.  Return in about 3 months (around 02/13/2024) for Follow up Triglycerides (labs 1 week prior) .  Patient to reach out to office if new, worrisome, or unresolved symptoms arise or if no improvement in patient's condition. Patient  verbalized understanding and is agreeable to treatment plan. All questions answered to patient's satisfaction.    Thersia Schuyler Stark, [place]

## 2023-11-14 NOTE — Patient Instructions (Addendum)
 Omega 3 Fish Oil:  2 gm twice daily - take consistently to lower triglyceride level    Things to do to keep yourself healthy: - Exercise at least 30-45 minutes a day, 3-4 days a week.  - Eat a low-fat diet with lots of fruits and vegetables, up to 7-9 servings per day.  - Seatbelts can save your life. Wear them always.  - Smoke detectors on every level of your home, check batteries every year.  - Eye Doctor - have an eye exam every 1-2 years  - Safe sex - if you may be exposed to STDs, use a condom.  - No smoking, vaping, or use of any tobacco products.  - Alcohol -  If you drink, do it moderately, less than 2 drinks per day.  - No illegal drug use.  - Depression is common in our stressful world.If you're feeling down or losing interest in things you normally enjoy, please come in for a visit.  - Violence - If anyone is threatening or hurting you, please call immediately.

## 2024-01-11 ENCOUNTER — Encounter (HOSPITAL_BASED_OUTPATIENT_CLINIC_OR_DEPARTMENT_OTHER): Payer: Self-pay | Admitting: Family Medicine

## 2024-01-14 ENCOUNTER — Ambulatory Visit: Payer: Self-pay | Admitting: Family Medicine

## 2024-01-14 NOTE — Telephone Encounter (Signed)
 FYI Only or Action Required?: FYI only for provider: appointment scheduled on 01/15/2024.  Patient was last seen in primary care on 11/14/2023 by Knute Thersia Bitters, FNP.  Called Nurse Triage reporting Palpitations.  Symptoms began about a month ago.  Triage Disposition: See PCP When Office is Open (Within 3 Days)  Patient/caregiver understands and will follow disposition?:       Copied from CRM [id number]. Topic: Clinical - Pink Word Triage >> Jan 14, 2024  3:05 PM Victoria B wrote: Patient having heart palpatations Reason for Disposition  [1] Palpitations AND [2] no improvement after using Care Advice  Answer Assessment - Initial Assessment Questions Heart palpitations Onset: Beginning of Oct   DESCRIPTION: Please describe your heart rate or heartbeat that you are having (e.g., fast/slow, regular/irregular, skipped or extra beats, palpitations)     HR 80 range, skipped beat sensation  DURATION: How long does it last (e.g., seconds, minutes, hours)     Just one time skipped beat when it happens; no more than 10 times a day; does not happen every day; once a week  OTHER SYMPTOMS: Do you have any other symptoms? (e.g., dizziness, chest pain, sweating, difficulty breathing)       Denies  Protocols used: Heart Rate and Heartbeat Questions-A-AH

## 2024-01-15 ENCOUNTER — Ambulatory Visit (INDEPENDENT_AMBULATORY_CARE_PROVIDER_SITE_OTHER): Admitting: Family Medicine

## 2024-01-15 VITALS — BP 136/88 | HR 94 | Temp 97.9°F | Ht 69.0 in | Wt 259.0 lb

## 2024-01-15 DIAGNOSIS — R002 Palpitations: Secondary | ICD-10-CM | POA: Insufficient documentation

## 2024-01-15 DIAGNOSIS — I10 Essential (primary) hypertension: Secondary | ICD-10-CM

## 2024-01-15 LAB — CBC WITH DIFFERENTIAL/PLATELET
Basophils Absolute: 0.1 K/uL (ref 0.0–0.1)
Basophils Relative: 1 % (ref 0.0–3.0)
Eosinophils Absolute: 0.1 K/uL (ref 0.0–0.7)
Eosinophils Relative: 2.1 % (ref 0.0–5.0)
HCT: 40.9 % (ref 39.0–52.0)
Hemoglobin: 14.4 g/dL (ref 13.0–17.0)
Lymphocytes Relative: 30.7 % (ref 12.0–46.0)
Lymphs Abs: 2.2 K/uL (ref 0.7–4.0)
MCHC: 35.1 g/dL (ref 30.0–36.0)
MCV: 86.5 fl (ref 78.0–100.0)
Monocytes Absolute: 0.7 K/uL (ref 0.1–1.0)
Monocytes Relative: 10 % (ref 3.0–12.0)
Neutro Abs: 4 K/uL (ref 1.4–7.7)
Neutrophils Relative %: 56.2 % (ref 43.0–77.0)
Platelets: 280 K/uL (ref 150.0–400.0)
RBC: 4.73 Mil/uL (ref 4.22–5.81)
RDW: 13.4 % (ref 11.5–15.5)
WBC: 7.1 K/uL (ref 4.0–10.5)

## 2024-01-15 LAB — COMPREHENSIVE METABOLIC PANEL WITH GFR
ALT: 35 U/L (ref 0–53)
AST: 24 U/L (ref 0–37)
Albumin: 4.7 g/dL (ref 3.5–5.2)
Alkaline Phosphatase: 52 U/L (ref 39–117)
BUN: 15 mg/dL (ref 6–23)
CO2: 27 meq/L (ref 19–32)
Calcium: 9.6 mg/dL (ref 8.4–10.5)
Chloride: 102 meq/L (ref 96–112)
Creatinine, Ser: 1.11 mg/dL (ref 0.40–1.50)
GFR: 87.45 mL/min (ref >60.00)
Glucose, Bld: 80 mg/dL (ref 70–99)
Potassium: 3.8 meq/L (ref 3.5–5.1)
Sodium: 139 meq/L (ref 135–145)
Total Bilirubin: 0.4 mg/dL (ref 0.2–1.2)
Total Protein: 7.7 g/dL (ref 6.0–8.3)

## 2024-01-15 MED ORDER — LISINOPRIL 10 MG PO TABS: 10.0000 mg | ORAL_TABLET | Freq: Every day | ORAL | 3 refills | Status: AC

## 2024-01-15 NOTE — Progress Notes (Signed)
 Acute Office Visit  Subjective:     Patient ID: Philip Martinez, male    DOB: 07/11/1990, 33 y.o.   MRN: [id number]  Chief Complaint  Patient presents with   Palpitations    Ongoing for the last month or so. Feels like heart is skipping beats    HPI  Discussed the use of AI scribe software for clinical note transcription with the patient, who gave verbal consent to proceed.  History of Present Illness Philip Martinez is a 33 year old male with hypertension who presents with palpitations.  Palpitations - Palpitations began suddenly around October 16th, 2025, during a fishing trip and have occurred almost daily since - A previous episode occurred about a year ago but did not recur until recently - No associated dizziness or vision changes - No peripheral edema - Monitors heart rate with a watch to calm himself  Hypertension and antihypertensive therapy - Hypertension managed with losartan  started in early September 2025 - Initially took half dose, increased to full pill due to insufficient blood pressure control - Palpitations began after dosage increase - Stopped losartan  on Friday, suspecting a connection to palpitations  Psychological symptoms - History of panic attacks  Substance use - Rare caffeine consumption, drinks decaf coffee - Smokes occasionally without purchasing tobacco - Alcohol consumed every two to three weeks, last intake on a Thursday before palpitations started     ROS Per HPI      Objective:    BP 136/88 (BP Location: Left Arm, Patient Position: Sitting)   Pulse 94   Temp 97.9 F (36.6 C) (Temporal)   Ht 5' 9 (1.753 m)   Wt 259 lb (117.5 kg)   SpO2 96%   BMI 38.25 kg/m    Physical Exam Vitals and nursing note reviewed.  Constitutional:      General: He is not in acute distress.    Appearance: Normal appearance.  HENT:     Head: Normocephalic and atraumatic.     Right Ear: External ear normal.     Left Ear: External ear  normal.     Nose: Nose normal.     Mouth/Throat:     Mouth: Mucous membranes are moist.     Pharynx: Oropharynx is clear.  Eyes:     Extraocular Movements: Extraocular movements intact.  Cardiovascular:     Rate and Rhythm: Normal rate and regular rhythm.     Pulses: Normal pulses.     Heart sounds: Normal heart sounds.  Pulmonary:     Effort: Pulmonary effort is normal. No respiratory distress.     Breath sounds: Normal breath sounds. No wheezing, rhonchi or rales.  Musculoskeletal:        General: Normal range of motion.     Cervical back: Normal range of motion.     Right lower leg: No edema.     Left lower leg: No edema.  Lymphadenopathy:     Cervical: No cervical adenopathy.  Skin:    General: Skin is warm and dry.  Neurological:     General: No focal deficit present.     Mental Status: He is alert and oriented to person, place, and time.  Psychiatric:        Mood and Affect: Mood normal.        Behavior: Behavior normal.    EKG: Reviewed and interpreted by me Indication: palpitations, HTN Rate: 92 Interpretation: NSR Changes from previous: none   No results found for any visits on 01/15/24.  Assessment & Plan:   Assessment and Plan Assessment & Plan Palpitations Intermittent palpitations possibly related to losartan  dose increase. No EKG abnormalities. Differential includes medication side effects, iron deficiency, or electrolyte imbalances. - Switched from losartan  to lisinopril, starting with a lower dose. - Ordered labs to check iron levels, kidney function, blood sugar, sodium, and potassium. - Consider heart monitor if palpitations persist after medication change.  Essential hypertension Hypertension managed with losartan . Blood pressure at 136/88 mmHg. Family history of hypertension and congestive heart failure. Palpitations noted after dose increase. - Switched from losartan  to lisinopril, starting with a lower dose. - Monitor blood pressure  response to lisinopril. - Follow up on December 2nd or 3rd to assess blood pressure control and medication tolerance.     Orders Placed This Encounter  Procedures   CBC with Differential/Platelet    Release to patient:   Immediate [1]   Comprehensive metabolic panel with GFR    Release to patient:   Immediate [1]   EKG 12-Lead     Meds ordered this encounter  Medications   lisinopril (ZESTRIL) 10 MG tablet    Sig: Take 1 tablet (10 mg total) by mouth daily.    Dispense:  31 tablet    Refill:  3    Return for in one  month as scheduled with PCP.  Corean LITTIE Ku, FNP

## 2024-01-15 NOTE — Patient Instructions (Signed)
EKG today

## 2024-01-17 ENCOUNTER — Ambulatory Visit: Payer: Self-pay | Admitting: Family Medicine

## 2024-02-13 ENCOUNTER — Ambulatory Visit (INDEPENDENT_AMBULATORY_CARE_PROVIDER_SITE_OTHER): Admitting: Family Medicine

## 2024-02-13 ENCOUNTER — Encounter (HOSPITAL_BASED_OUTPATIENT_CLINIC_OR_DEPARTMENT_OTHER): Payer: Self-pay | Admitting: Family Medicine

## 2024-02-13 ENCOUNTER — Ambulatory Visit: Attending: Family Medicine

## 2024-02-13 VITALS — BP 122/82 | HR 88 | Ht 69.0 in | Wt 261.0 lb

## 2024-02-13 DIAGNOSIS — E781 Pure hyperglyceridemia: Secondary | ICD-10-CM

## 2024-02-13 DIAGNOSIS — R002 Palpitations: Secondary | ICD-10-CM | POA: Diagnosis not present

## 2024-02-13 DIAGNOSIS — I10 Essential (primary) hypertension: Secondary | ICD-10-CM

## 2024-02-13 NOTE — Patient Instructions (Signed)
  VISIT SUMMARY: You came in today to discuss your palpitations and to follow up on your cholesterol and blood pressure management. You mentioned that your palpitations have become more noticeable recently, especially with physical exertion or after eating. We reviewed your current medications and lifestyle, and you are due for a cholesterol check today.  YOUR PLAN: -PALPITATIONS: Palpitations are sensations of a rapid, strong, or irregular heartbeat. We have ordered a Zio patch for two-week heart rhythm monitoring to better understand your symptoms. You have also been referred to a cardiologist for further evaluation. We checked your thyroid function to rule out any thyroid issues. If your palpitations worsen or you experience severe symptoms, please visit the ER immediately.  -PRIMARY HYPERTENSION: Primary hypertension is high blood pressure with no identifiable cause. Your blood pressure is well-controlled with your current medication, lisinopril . Please continue taking lisinopril  as prescribed.  -MIXED HYPERLIPIDEMIA: Mixed hyperlipidemia is a condition with high levels of cholesterol and triglycerides in the blood. You are currently taking atorvastatin  40 mg nightly without significant side effects. We have ordered a cholesterol panel to check your levels. Additionally, you are encouraged to resume your gym activities to help manage your cholesterol.  INSTRUCTIONS: Please follow up with the cardiologist as referred. Continue taking your medications as prescribed. If your palpitations worsen or you experience severe symptoms, visit the ER immediately. We will contact you with the results of your cholesterol panel and thyroid function tests.
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
## 2024-02-13 NOTE — Progress Notes (Unsigned)
 EP to read.

## 2024-02-14 ENCOUNTER — Ambulatory Visit (HOSPITAL_BASED_OUTPATIENT_CLINIC_OR_DEPARTMENT_OTHER): Payer: Self-pay | Admitting: Family Medicine

## 2024-02-14 ENCOUNTER — Other Ambulatory Visit (HOSPITAL_BASED_OUTPATIENT_CLINIC_OR_DEPARTMENT_OTHER): Payer: Self-pay | Admitting: Family Medicine

## 2024-02-14 LAB — LIPID PANEL
Chol/HDL Ratio: 8.8 ratio — ABNORMAL HIGH (ref 0.0–5.0)
Cholesterol, Total: 325 mg/dL — ABNORMAL HIGH (ref 100–199)
HDL: 37 mg/dL — ABNORMAL LOW (ref >39)
Triglycerides: 860 mg/dL (ref 0–149)

## 2024-02-14 LAB — TSH RFX ON ABNORMAL TO FREE T4: TSH: 2.56 u[IU]/mL (ref 0.450–4.500)

## 2024-02-14 MED ORDER — ICOSAPENT ETHYL 1 G PO CAPS: 2.0000 g | ORAL_CAPSULE | Freq: Two times a day (BID) | ORAL | 2 refills | Status: DC

## 2024-02-14 NOTE — Progress Notes (Signed)
 Hi Philip Martinez, I have attempted to call you as your triglycerides are significantly elevated.  I would like you to continue taking your atorvastatin  daily (if you have run out please let me know).  I am adding on a medication called icosapent ethyl which will help with your triglyceride level.  I would like to recheck this in 4 weeks.  Please discontinue any use of alcohol as this can significantly increase your triglycerides.  Level is high can increase risk for pancreatitis as well.  Please let me know that you have received this message.  I am also going to include a link for a coupon code for the medication.  If you are unable to fill it due to cost please let me know and we will try an alternative.  [email or web address]

## 2024-02-14 NOTE — Telephone Encounter (Signed)
 Pt responded back to prior message. Routing to [employer].

## 2024-02-18 ENCOUNTER — Other Ambulatory Visit (HOSPITAL_COMMUNITY): Payer: Self-pay

## 2024-02-18 ENCOUNTER — Telehealth (HOSPITAL_BASED_OUTPATIENT_CLINIC_OR_DEPARTMENT_OTHER): Payer: Self-pay | Admitting: Pharmacy Technician

## 2024-02-18 ENCOUNTER — Encounter (HOSPITAL_BASED_OUTPATIENT_CLINIC_OR_DEPARTMENT_OTHER): Payer: Self-pay | Admitting: Family Medicine

## 2024-02-18 NOTE — Telephone Encounter (Signed)
 Pharmacy Patient Advocate Encounter  Received notification from [employer] that Prior Authorization for Icosapent  Ethyl 1GM capsules has been APPROVED from 01/19/2024 to 02/17/2025. Ran test claim, Copay is $155.52. This test claim was processed through [facility]- copay amounts may vary at other pharmacies due to pharmacy/plan contracts, or as the patient moves through the different stages of their insurance plan.   PA #/Case ID/Reference #: [id number]

## 2024-02-18 NOTE — Telephone Encounter (Signed)
 Pharmacy Patient Advocate Encounter   Received notification from [employer] that prior authorization for Icosapent  Ethyl 1GM capsules is required/requested.   Insurance verification completed.   The patient is insured through [employer].   Per test claim: PA required; PA submitted to above mentioned insurance via Latent Key/confirmation #/[id number] Status is pending

## 2024-02-19 ENCOUNTER — Other Ambulatory Visit (HOSPITAL_BASED_OUTPATIENT_CLINIC_OR_DEPARTMENT_OTHER): Payer: Self-pay | Admitting: Family Medicine

## 2024-02-19 MED ORDER — ICOSAPENT ETHYL 1 G PO CAPS: 2.0000 g | ORAL_CAPSULE | Freq: Two times a day (BID) | ORAL | 2 refills | Status: AC

## 2024-02-19 NOTE — Telephone Encounter (Signed)
 Please see mychart message sent by pt and advise.

## 2024-02-26 ENCOUNTER — Other Ambulatory Visit (HOSPITAL_BASED_OUTPATIENT_CLINIC_OR_DEPARTMENT_OTHER): Payer: Self-pay | Admitting: Family Medicine

## 2024-02-26 DIAGNOSIS — E781 Pure hyperglyceridemia: Secondary | ICD-10-CM

## 2024-02-26 NOTE — Progress Notes (Signed)
 Please schedule a lab visit within 4-6 weeks of patient starting his Vascepa  medication. Labs are entered.

## 2024-02-27 ENCOUNTER — Encounter (HOSPITAL_BASED_OUTPATIENT_CLINIC_OR_DEPARTMENT_OTHER): Payer: Self-pay | Admitting: *Deleted

## 2024-03-15 DIAGNOSIS — R002 Palpitations: Secondary | ICD-10-CM

## 2024-04-02 NOTE — Progress Notes (Signed)
 Hi Philip Martinez,  Your zio patch study indicated your palpitations  correlate with a  normal sinus rhythm and at times sinus rhythm with an extra beat. At this time, I would recommend following up with Cardiology for further evaluation. If you have questions, please let me know.

## 2024-04-15 ENCOUNTER — Encounter (HOSPITAL_BASED_OUTPATIENT_CLINIC_OR_DEPARTMENT_OTHER): Payer: Self-pay | Admitting: Family Medicine

## 2024-04-15 ENCOUNTER — Ambulatory Visit (INDEPENDENT_AMBULATORY_CARE_PROVIDER_SITE_OTHER): Admitting: Family Medicine

## 2024-04-15 VITALS — BP 129/86 | HR 82 | Ht 69.0 in | Wt 258.0 lb

## 2024-04-15 DIAGNOSIS — R002 Palpitations: Secondary | ICD-10-CM | POA: Diagnosis not present

## 2024-04-15 DIAGNOSIS — E781 Pure hyperglyceridemia: Secondary | ICD-10-CM | POA: Diagnosis not present

## 2024-04-15 NOTE — Patient Instructions (Signed)
 Please return for fasting blood work.  For fasting, if your blood work is in the morning please do not eat any food after midnight.  You may have water or black coffee prior to your lab work.  Please take all regularly prescribed medications even if you are fasting.  If your blood work is in the afternoon, please fast for at least 5 to 6 hours.  You may continue to drink water and/or black coffee prior to your lab work.  Please take all scheduled medications even if you are fasting.

## 2024-04-18 LAB — LIPID PANEL
Chol/HDL Ratio: 4.9 ratio (ref 0.0–5.0)
Cholesterol, Total: 206 mg/dL — ABNORMAL HIGH (ref 100–199)
HDL: 42 mg/dL
LDL Chol Calc (NIH): 107 mg/dL — ABNORMAL HIGH (ref 0–99)
Triglycerides: 331 mg/dL — ABNORMAL HIGH (ref 0–149)
VLDL Cholesterol Cal: 57 mg/dL — ABNORMAL HIGH (ref 5–40)

## 2024-04-18 LAB — HEMOGLOBIN A1C
Est. average glucose Bld gHb Est-mCnc: 120 mg/dL
Hgb A1c MFr Bld: 5.8 % — ABNORMAL HIGH (ref 4.8–5.6)

## 2024-08-13 ENCOUNTER — Ambulatory Visit (HOSPITAL_BASED_OUTPATIENT_CLINIC_OR_DEPARTMENT_OTHER): Admitting: Family Medicine

## 2024-11-18 ENCOUNTER — Encounter (HOSPITAL_BASED_OUTPATIENT_CLINIC_OR_DEPARTMENT_OTHER): Admitting: Family Medicine
